# Patient Record
Sex: Female | Born: 1980
Health system: Southern US, Community
[De-identification: ages and names within clinical notes are randomized; demographics above are authoritative.]

## PROBLEM LIST (undated history)

## (undated) ENCOUNTER — Ambulatory Visit (HOSPITAL_COMMUNITY): Admission: EM | Discharge status: Absent without leave | Payer: Self-pay

## (undated) DIAGNOSIS — H6690 Otitis media, unspecified, unspecified ear: Secondary | ICD-10-CM

## (undated) HISTORY — DX: Otitis media, unspecified, unspecified ear: H66.90

---
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
## 2000-01-27 ENCOUNTER — Other Ambulatory Visit: Admission: RE | Admit: 2000-01-27 | Discharge: 2000-01-27 | Payer: Self-pay | Admitting: Gynecology

## 2000-08-01 ENCOUNTER — Encounter: Payer: Self-pay | Admitting: Infectious Diseases

## 2000-08-01 ENCOUNTER — Ambulatory Visit (HOSPITAL_COMMUNITY): Admission: RE | Admit: 2000-08-01 | Discharge: 2000-08-01 | Payer: Self-pay | Admitting: Infectious Diseases

## 2001-04-12 ENCOUNTER — Other Ambulatory Visit: Admission: RE | Admit: 2001-04-12 | Discharge: 2001-04-12 | Payer: Self-pay | Admitting: Obstetrics and Gynecology

## 2002-07-03 ENCOUNTER — Other Ambulatory Visit: Admission: RE | Admit: 2002-07-03 | Discharge: 2002-07-03 | Payer: Self-pay | Admitting: Obstetrics and Gynecology

## 2003-08-02 ENCOUNTER — Other Ambulatory Visit: Admission: RE | Admit: 2003-08-02 | Discharge: 2003-08-02 | Payer: Self-pay | Admitting: Obstetrics and Gynecology

## 2003-10-02 ENCOUNTER — Ambulatory Visit (HOSPITAL_COMMUNITY): Admission: RE | Admit: 2003-10-02 | Discharge: 2003-10-02 | Payer: Self-pay | Admitting: Gastroenterology

## 2004-09-03 ENCOUNTER — Other Ambulatory Visit: Admission: RE | Admit: 2004-09-03 | Discharge: 2004-09-03 | Payer: Self-pay | Admitting: Obstetrics and Gynecology

## 2004-12-21 ENCOUNTER — Encounter: Admission: RE | Admit: 2004-12-21 | Discharge: 2005-01-06 | Payer: Self-pay | Admitting: Nurse Practitioner

## 2005-05-28 ENCOUNTER — Encounter: Admission: RE | Admit: 2005-05-28 | Discharge: 2005-05-28 | Payer: Self-pay | Admitting: Occupational Medicine

## 2007-07-31 ENCOUNTER — Ambulatory Visit (HOSPITAL_COMMUNITY): Admission: RE | Admit: 2007-07-31 | Discharge: 2007-07-31 | Payer: Self-pay | Admitting: Gastroenterology

## 2010-04-29 ENCOUNTER — Ambulatory Visit (HOSPITAL_COMMUNITY)
Admission: RE | Admit: 2010-04-29 | Discharge: 2010-04-29 | Payer: Self-pay | Source: Home / Self Care | Admitting: Gastroenterology

## 2010-06-20 ENCOUNTER — Encounter: Payer: Self-pay | Admitting: Gastroenterology

## 2010-06-24 ENCOUNTER — Encounter
Admission: RE | Admit: 2010-06-24 | Discharge: 2010-06-24 | Payer: Self-pay | Source: Home / Self Care | Attending: Gastroenterology | Admitting: Gastroenterology

## 2013-06-09 ENCOUNTER — Ambulatory Visit (INDEPENDENT_AMBULATORY_CARE_PROVIDER_SITE_OTHER): Payer: BC Managed Care – PPO | Admitting: Family Medicine

## 2013-06-09 VITALS — BP 130/80 | HR 86 | Temp 99.0°F | Resp 16 | Ht 64.5 in | Wt 151.4 lb

## 2013-06-09 DIAGNOSIS — R6889 Other general symptoms and signs: Secondary | ICD-10-CM

## 2013-06-09 LAB — POCT CBC
Granulocyte percent: 62.6 %G (ref 37–80)
HCT, POC: 44.2 % (ref 37.7–47.9)
Hemoglobin: 13.9 g/dL (ref 12.2–16.2)
Lymph, poc: 1.4 (ref 0.6–3.4)
MCH, POC: 28.8 pg (ref 27–31.2)
MCHC: 31.4 g/dL — AB (ref 31.8–35.4)
MCV: 91.6 fL (ref 80–97)
MID (cbc): 0.3 (ref 0–0.9)
MPV: 8.3 fL (ref 0–99.8)
POC Granulocyte: 2.9 (ref 2–6.9)
POC LYMPH PERCENT: 30.2 %L (ref 10–50)
POC MID %: 7.2 %M (ref 0–12)
Platelet Count, POC: 242 10*3/uL (ref 142–424)
RBC: 4.82 M/uL (ref 4.04–5.48)
RDW, POC: 12.9 %
WBC: 4.6 10*3/uL (ref 4.6–10.2)

## 2013-06-09 MED ORDER — IPRATROPIUM BROMIDE 0.03 % NA SOLN: 2.0000 | Freq: Four times a day (QID) | NASAL | Status: DC

## 2013-06-09 MED ORDER — HYDROCODONE-HOMATROPINE 5-1.5 MG/5ML PO SYRP: 5.0000 mL | ORAL_SOLUTION | Freq: Three times a day (TID) | ORAL | Status: DC | PRN

## 2013-06-09 MED ORDER — PSEUDOEPHEDRINE HCL ER 120 MG PO TB12: 120.0000 mg | ORAL_TABLET | Freq: Two times a day (BID) | ORAL | Status: DC

## 2013-06-09 NOTE — Patient Instructions (Signed)
Hot showers or breathing in steam may help loosen the congestion.  Using a netti pot or sinus rinse is also likely to help you feel better and keep this from progressing.  Use the atrovent nasal spray as needed throughout the day.  I recommend augmenting with 12 hr sudafed (behind the counter) and generic mucinex to help you move out the congestion.  If no improvement or you are getting worse, come back as you might need a course of steroids but hopefully with all of the above, you can avoid it.  Influenza, Adult Influenza ("the flu") is a viral infection of the respiratory tract. It occurs more often in winter months because people spend more time in close contact with one another. Influenza can make you feel very sick. Influenza easily spreads from person to person (contagious). CAUSES  Influenza is caused by a virus that infects the respiratory tract. You can catch the virus by breathing in droplets from an infected person's cough or sneeze. You can also catch the virus by touching something that was recently contaminated with the virus and then touching your mouth, nose, or eyes. SYMPTOMS  Symptoms typically last 4 to 10 days and may include:  Fever.  Chills.  Headache, body aches, and muscle aches.  Sore throat.  Chest discomfort and cough.  Poor appetite.  Weakness or feeling tired.  Dizziness.  Nausea or vomiting. DIAGNOSIS  Diagnosis of influenza is often made based on your history and a physical exam. A nose or throat swab test can be done to confirm the diagnosis. RISKS AND COMPLICATIONS You may be at risk for a more severe case of influenza if you smoke cigarettes, have diabetes, have chronic heart disease (such as heart failure) or lung disease (such as asthma), or if you have a weakened immune system. Elderly people and pregnant women are also at risk for more serious infections. The most common complication of influenza is a lung infection (pneumonia). Sometimes, this  complication can require emergency medical care and may be life-threatening. PREVENTION  An annual influenza vaccination (flu shot) is the best way to avoid getting influenza. An annual flu shot is now routinely recommended for all adults in the [place] TREATMENT  In mild cases, influenza goes away on its own. Treatment is directed at relieving symptoms. For more severe cases, your caregiver may prescribe antiviral medicines to shorten the sickness. Antibiotic medicines are not effective, because the infection is caused by a virus, not by bacteria. HOME CARE INSTRUCTIONS  Only take over-the-counter or prescription medicines for pain, discomfort, or fever as directed by your caregiver.  Use a cool mist humidifier to make breathing easier.  Get plenty of rest until your temperature returns to normal. This usually takes 3 to 4 days.  Drink enough fluids to keep your urine clear or pale yellow.  Cover your mouth and nose when coughing or sneezing, and wash your hands well to avoid spreading the virus.  Stay home from work or school until your fever has been gone for at least 1 full day. SEEK MEDICAL CARE IF:   You have chest pain or a deep cough that worsens or produces more mucus.  You have nausea, vomiting, or diarrhea. SEEK IMMEDIATE MEDICAL CARE IF:   You have difficulty breathing, shortness of breath, or your skin or nails turn bluish.  You have severe neck pain or stiffness.  You have a severe headache, facial pain, or earache.  You have a worsening or recurring fever.  You  have nausea or vomiting that cannot be controlled. MAKE SURE YOU:  Understand these instructions.  Will watch your condition.  Will get help right away if you are not doing well or get worse. Document Released: 05/14/2000 Document Revised: 11/16/2011 Document Reviewed: 08/16/2011 [facility] Patient Information 2014 [place]Care, [place].

## 2013-06-09 NOTE — Progress Notes (Signed)
Subjective:    Patient ID: Sierra Foster, female    DOB: [birth year], 33 y.o.   MRN: [id number] Chief Complaint  Patient presents with  . Follow-up    diagnosed with flu Tues just not feeling better still running fever now has bloody nose    HPI This chart was scribed for Norberto Sorenson, MD by Andrew Au, ED Scribe. This patient was seen in room 1 and the patient's care was started at 9:28 AM.  HPI Comments: Sierra Foster is a 33 y.o. female who presents to the [facility] complaining of a constant, worsening, unproductive cough onset 5 days ago. Pt reports her coughing worsens at night. She has associated nasal congestion and fatigue. She reports that when she blows her nose she has seen blood with mucous.  Pt states that she has been sick for about 2 weeks. [facility] for the flu which resulted positive. She was prescribed Tessalon pearls and cough syrup with relief to symotoms. She was not rx'ed tamiflu as she had been ill for 2 wks but she thinks she was getting a little better and then got significantly worse w/ f/c/myalgias 2d prior to + flu test. She also has been taking mucinex for her symptoms. She reports having intermittent fevers of about 101.2 every night since Tuesday. She denies ear pain and jaw pain.    No past medical history on file. Allergies  Allergen Reactions  . Penicillins    Prior to Admission medications   Medication Sig Start Date End Date Taking? Authorizing Provider  benzonatate (TESSALON) 100 MG capsule Take by mouth 3 (three) times daily as needed for cough.   Yes Historical Provider, MD  cetirizine (ZYRTEC) 10 MG tablet Take 10 mg by mouth daily.   Yes Historical Provider, MD  dextromethorphan-guaiFENesin (ROBITUSSIN-DM) 10-100 MG/5ML liquid Take by mouth every 4 (four) hours as needed for cough.   Yes Historical Provider, MD  etonogestrel-ethinyl estradiol (NUVARING) 0.12-0.015 MG/24HR vaginal ring Place 1 each  vaginally every 28 (twenty-eight) days. Insert vaginally and leave in place for 3 consecutive weeks, then remove for 1 week.   Yes Historical Provider, MD        Review of Systems  Constitutional: Positive for fever, chills, diaphoresis, activity change, appetite change and fatigue.  HENT: Positive for congestion, nosebleeds, postnasal drip, rhinorrhea and sore throat. Negative for ear discharge, ear pain, mouth sores, sinus pressure, sneezing, trouble swallowing and voice change.        No jaw pain  Eyes: Negative for pain.  Respiratory: Positive for cough. Negative for shortness of breath.   Cardiovascular: Negative for chest pain.  Gastrointestinal: Negative for vomiting and abdominal pain.  Genitourinary: Negative for dysuria.  Musculoskeletal: Positive for myalgias. Negative for arthralgias, gait problem, joint swelling, neck pain and neck stiffness.  Skin: Negative for rash.  Neurological: Positive for weakness and headaches. Negative for dizziness and syncope.  Hematological: Negative for adenopathy.  Psychiatric/Behavioral: Positive for sleep disturbance.       Objective:   Physical Exam  Nursing note and vitals reviewed. Constitutional: She is oriented to person, place, and time. She appears well-developed and well-nourished. No distress.  HENT:  Head: Normocephalic and atraumatic.  Right Ear: Tympanic membrane normal.  Left Ear: Tympanic membrane normal.  Nose: Mucosal edema ( left worse in right) present.  Mouth/Throat: Posterior oropharyngeal erythema present.  Eyes: EOM are normal.  Neck: Trachea normal. Neck supple. No mass  and no thyromegaly present.  Cardiovascular: Normal rate, regular rhythm and normal heart sounds.   Pulmonary/Chest: Effort normal. No respiratory distress.  Musculoskeletal: Normal range of motion.  Lymphadenopathy:       Head (right side): No submandibular, no preauricular and no posterior auricular adenopathy present.       Head (left  side): No submandibular, no preauricular and no posterior auricular adenopathy present.    She has cervical adenopathy.       Right cervical: Superficial cervical adenopathy present. No posterior cervical adenopathy present.      Left cervical: Superficial cervical adenopathy present. No posterior cervical adenopathy present.       Right: No supraclavicular adenopathy present.       Left: No supraclavicular adenopathy present.  Neurological: She is alert and oriented to person, place, and time.  Skin: Skin is warm and dry.  Psychiatric: She has a normal mood and affect. Her behavior is normal.    BP 130/80  Pulse 86  Temp(Src) 99 F (37.2 C) (Oral)  Resp 16  Ht 5' 4.5" (1.638 m)  Wt 151 lb 6.4 oz (68.675 kg)  BMI 25.60 kg/m2  SpO2 97%  LMP 05/26/2013  Results for orders placed in visit on 06/09/13  POCT CBC      Result Value Range   WBC 4.6  4.6 - 10.2 K/uL   Lymph, poc 1.4  0.6 - 3.4   POC LYMPH PERCENT 30.2  10 - 50 %L   MID (cbc) 0.3  0 - 0.9   POC MID % 7.2  0 - 12 %M   POC Granulocyte 2.9  2 - 6.9   Granulocyte percent 62.6  37 - 80 %G   RBC 4.82  4.04 - 5.48 M/uL   Hemoglobin 13.9  12.2 - 16.2 g/dL   HCT, POC 16.144.2  09.637.7 - 47.9 %   MCV 91.6  80 - 97 fL   MCH, POC 28.8  27 - 31.2 pg   MCHC 31.4 (*) 31.8 - 35.4 g/dL   RDW, POC 04.512.9     Platelet Count, POC 242  142 - 424 K/uL   MPV 8.3  0 - 99.8 fL        Assessment & Plan:   Flu-like symptoms - Plan: POCT CBC Cont symptomatic treatment - out of work note given. Meds ordered this encounter  Medications  . etonogestrel-ethinyl estradiol (NUVARING) 0.12-0.015 MG/24HR vaginal ring    Sig: Place 1 each vaginally every 28 (twenty-eight) days. Insert vaginally and leave in place for 3 consecutive weeks, then remove for 1 week.  . benzonatate (TESSALON) 100 MG capsule    Sig: Take by mouth 3 (three) times daily as needed for cough.  . cetirizine (ZYRTEC) 10 MG tablet    Sig: Take 10 mg by mouth daily.  Marland Kitchen.  dextromethorphan-guaiFENesin (ROBITUSSIN-DM) 10-100 MG/5ML liquid    Sig: Take by mouth every 4 (four) hours as needed for cough.  Marland Kitchen. HYDROcodone-homatropine (HYCODAN) 5-1.5 MG/5ML syrup    Sig: Take 5 mLs by mouth every 8 (eight) hours as needed for cough.    Dispense:  120 mL    Refill:  0  . pseudoephedrine (SUDAFED 12 HOUR) 120 MG 12 hr tablet    Sig: Take 1 tablet (120 mg total) by mouth 2 (two) times daily.    Dispense:  30 tablet    Refill:  0  . ipratropium (ATROVENT) 0.03 % nasal spray    Sig: Place 2 sprays into  the nose 4 (four) times daily.    Dispense:  30 mL    Refill:  1    I personally performed the services described in this documentation, which was scribed in my presence. The recorded information has been reviewed and considered, and addended by me as needed.  Norberto Sorenson, MD MPH

## 2013-06-12 DIAGNOSIS — Z0271 Encounter for disability determination: Secondary | ICD-10-CM

## 2013-06-15 ENCOUNTER — Telehealth: Payer: Self-pay

## 2013-06-15 NOTE — Telephone Encounter (Signed)
FMLA paperwork in Dr. Alver FisherShaw's box to be completed.

## 2013-06-19 NOTE — Telephone Encounter (Signed)
Dr. Clelia CroftShaw I put this paperwork in your box last week. Patient has called to check on status. Can you please let me know if you have received this and if I need to get any more information from the patient.  Thanks! Dala DockEliana

## 2013-06-19 NOTE — Telephone Encounter (Signed)
completed

## 2013-06-21 NOTE — Telephone Encounter (Signed)
Paperwork in pickup drawer. LMOM for patient.

## 2013-07-03 ENCOUNTER — Encounter (HOSPITAL_BASED_OUTPATIENT_CLINIC_OR_DEPARTMENT_OTHER): Payer: Self-pay | Admitting: Emergency Medicine

## 2013-07-03 ENCOUNTER — Emergency Department (HOSPITAL_BASED_OUTPATIENT_CLINIC_OR_DEPARTMENT_OTHER)
Admission: EM | Admit: 2013-07-03 | Discharge: 2013-07-03 | Disposition: A | Discharge status: Home / Self Care | Payer: BC Managed Care – PPO | Attending: Emergency Medicine | Admitting: Emergency Medicine

## 2013-07-03 DIAGNOSIS — K5289 Other specified noninfective gastroenteritis and colitis: Secondary | ICD-10-CM | POA: Insufficient documentation

## 2013-07-03 DIAGNOSIS — K529 Noninfective gastroenteritis and colitis, unspecified: Secondary | ICD-10-CM

## 2013-07-03 DIAGNOSIS — Z880 Allergy status to penicillin: Secondary | ICD-10-CM | POA: Insufficient documentation

## 2013-07-03 DIAGNOSIS — Z3202 Encounter for pregnancy test, result negative: Secondary | ICD-10-CM | POA: Insufficient documentation

## 2013-07-03 DIAGNOSIS — Z79899 Other long term (current) drug therapy: Secondary | ICD-10-CM | POA: Insufficient documentation

## 2013-07-03 LAB — URINE MICROSCOPIC-ADD ON

## 2013-07-03 LAB — URINALYSIS, ROUTINE W REFLEX MICROSCOPIC
Bilirubin Urine: NEGATIVE
Glucose, UA: NEGATIVE mg/dL
Ketones, ur: 15 mg/dL — AB
Leukocytes, UA: NEGATIVE
Nitrite: NEGATIVE
Protein, ur: NEGATIVE mg/dL
Specific Gravity, Urine: 1.021 (ref 1.005–1.030)
Urobilinogen, UA: 0.2 mg/dL (ref 0.0–1.0)
pH: 5 (ref 5.0–8.0)

## 2013-07-03 LAB — PREGNANCY, URINE: Preg Test, Ur: NEGATIVE

## 2013-07-03 MED ORDER — ONDANSETRON HCL 4 MG/2ML IJ SOLN
4.0000 mg | Freq: Once | INTRAMUSCULAR | Status: AC
  Administered 2013-07-03: 4 mg via INTRAVENOUS
  Filled 2013-07-03: qty 2

## 2013-07-03 MED ORDER — ONDANSETRON 8 MG PO TBDP: ORAL_TABLET | ORAL | Status: DC

## 2013-07-03 MED ORDER — SODIUM CHLORIDE 0.9 % IV SOLN
Freq: Once | INTRAVENOUS | Status: AC
  Administered 2013-07-03: 1000 mL via INTRAVENOUS

## 2013-07-03 MED ORDER — SODIUM CHLORIDE 0.9 % IV BOLUS (SEPSIS)
1000.0000 mL | Freq: Once | INTRAVENOUS | Status: AC
  Administered 2013-07-03: 1000 mL via INTRAVENOUS

## 2013-07-03 NOTE — ED Provider Notes (Signed)
CSN: [id number]     Arrival date & time 07/03/13  0305 History   First MD Initiated Contact with Patient 07/03/13 0320     Chief Complaint  Patient presents with  . Diarrhea   (Consider location/radiation/quality/duration/timing/severity/associated sxs/prior Treatment) HPI Comments: Patient presents with nausea vomiting and diarrhea. She states this started earlier this evening around 7:00. Her first symptoms were chills and headache. Shortly thereafter she started having watery diarrhea and ongoing nonbloody, nonbilious vomiting. She hasn't had any known fevers. She hasn't been able to keep anything down since that time. She has intermittent crampy abdominal pain. She denies any urinary symptoms. She's not taking anything at home for the symptoms.  Patient is a 33 y.o. female presenting with diarrhea.  Diarrhea Associated symptoms: abdominal pain, chills, headaches, myalgias and vomiting   Associated symptoms: no fever     History reviewed. No pertinent past medical history. History reviewed. No pertinent past surgical history. History reviewed. No pertinent family history. History  Substance Use Topics  . Smoking status: Never Smoker   . Smokeless tobacco: Not on file  . Alcohol Use: Not on file   OB History   Grav Para Term Preterm Abortions TAB SAB Ect Mult Living                 Review of Systems  Constitutional: Positive for chills and appetite change. Negative for fever.  Respiratory: Negative for chest tightness and shortness of breath.   Cardiovascular: Negative for chest pain.  Gastrointestinal: Positive for nausea, vomiting, abdominal pain and diarrhea. Negative for blood in stool and abdominal distention.  Genitourinary: Negative for dysuria.  Musculoskeletal: Positive for myalgias.  Neurological: Positive for headaches. Negative for light-headedness.  All other systems reviewed and are negative.    Allergies  Penicillins  Home Medications   Current  Outpatient Rx  Name  Route  Sig  Dispense  Refill  . benzonatate (TESSALON) 100 MG capsule   Oral   Take by mouth 3 (three) times daily as needed for cough.         . cetirizine (ZYRTEC) 10 MG tablet   Oral   Take 10 mg by mouth daily.         Marland Kitchen. dextromethorphan-guaiFENesin (ROBITUSSIN-DM) 10-100 MG/5ML liquid   Oral   Take by mouth every 4 (four) hours as needed for cough.         . etonogestrel-ethinyl estradiol (NUVARING) 0.12-0.015 MG/24HR vaginal ring   Vaginal   Place 1 each vaginally every 28 (twenty-eight) days. Insert vaginally and leave in place for 3 consecutive weeks, then remove for 1 week.         Marland Kitchen. HYDROcodone-homatropine (HYCODAN) 5-1.5 MG/5ML syrup   Oral   Take 5 mLs by mouth every 8 (eight) hours as needed for cough.   120 mL   0   . ipratropium (ATROVENT) 0.03 % nasal spray   Nasal   Place 2 sprays into the nose 4 (four) times daily.   30 mL   1   . ondansetron (ZOFRAN ODT) 8 MG disintegrating tablet      8mg  ODT q4 hours prn nausea   10 tablet   0   . pseudoephedrine (SUDAFED 12 HOUR) 120 MG 12 hr tablet   Oral   Take 1 tablet (120 mg total) by mouth 2 (two) times daily.   30 tablet   0    BP 133/75  Pulse 103  Temp(Src) 98.7 F (37.1 C) (Oral)  Resp 18  Ht 5\' 4"  (1.626 m)  Wt 148 lb (67.132 kg)  BMI 25.39 kg/m2  SpO2 100%  LMP 06/26/2013 Physical Exam  Constitutional: She is oriented to person, place, and time. She appears well-developed and well-nourished.  HENT:  Head: Normocephalic and atraumatic.  Mouth/Throat: Oropharynx is clear and moist.  Eyes: Pupils are equal, round, and reactive to light.  Neck: Normal range of motion. Neck supple.  Cardiovascular: Normal rate, regular rhythm and normal heart sounds.   Pulmonary/Chest: Effort normal and breath sounds normal. No respiratory distress. She has no wheezes. She has no rales. She exhibits no tenderness.  Abdominal: Soft. Bowel sounds are normal. There is no tenderness.  There is no rebound and no guarding.  Musculoskeletal: Normal range of motion. She exhibits no edema.  Lymphadenopathy:    She has no cervical adenopathy.  Neurological: She is alert and oriented to person, place, and time.  Skin: Skin is warm and dry. No rash noted.  Psychiatric: She has a normal mood and affect.    ED Course  Procedures (including critical care time) Labs Review Labs Reviewed  URINALYSIS, ROUTINE W REFLEX MICROSCOPIC - Abnormal; Notable for the following:    APPearance CLOUDY (*)    Hgb urine dipstick MODERATE (*)    Ketones, ur 15 (*)    All other components within normal limits  URINE MICROSCOPIC-ADD ON - Abnormal; Notable for the following:    Bacteria, UA FEW (*)    All other components within normal limits  PREGNANCY, URINE   Imaging Review No results found.  EKG Interpretation   None       MDM   1. Gastroenteritis    Patient presents with vomiting and diarrhea. She reports some abdominal cramping but has no significant abdominal tenderness on exam. Her symptoms are most consistent with a viral gastroenteritis. She has no tenderness around her appendix area or gallbladder. She has no evidence of urinary tract infection. She improved after some IV fluids and Zofran. She had no ongoing vomiting. She was discharged home in good condition. I gave her prescription for Zofran to use as needed. I encouraged her to stay on clear liquids for the next 12-24 hours. I did advise her that she had some microscopic hematuria and at some point in the near future she needs to have her urine rechecked by her PMD to make sure the blood clears.    Rolan Bucco, MD 07/03/13 [phone]

## 2013-07-03 NOTE — Discharge Instructions (Signed)
Viral Gastroenteritis Viral gastroenteritis is also known as stomach flu. This condition affects the stomach and intestinal tract. It can cause sudden diarrhea and vomiting. The illness typically lasts 3 to 8 days. Most people develop an immune response that eventually gets rid of the virus. While this natural response develops, the virus can make you quite ill. CAUSES  Many different viruses can cause gastroenteritis, such as rotavirus or noroviruses. You can catch one of these viruses by consuming contaminated food or water. You may also catch a virus by sharing utensils or other personal items with an infected person or by touching a contaminated surface. SYMPTOMS  The most common symptoms are diarrhea and vomiting. These problems can cause a severe loss of body fluids (dehydration) and a body salt (electrolyte) imbalance. Other symptoms may include:  Fever.  Headache.  Fatigue.  Abdominal pain. DIAGNOSIS  Your caregiver can usually diagnose viral gastroenteritis based on your symptoms and a physical exam. A stool sample may also be taken to test for the presence of viruses or other infections. TREATMENT  This illness typically goes away on its own. Treatments are aimed at rehydration. The most serious cases of viral gastroenteritis involve vomiting so severely that you are not able to keep fluids down. In these cases, fluids must be given through an intravenous line (IV). HOME CARE INSTRUCTIONS   Drink enough fluids to keep your urine clear or pale yellow. Drink small amounts of fluids frequently and increase the amounts as tolerated.  Ask your caregiver for specific rehydration instructions.  Avoid:  Foods high in sugar.  Alcohol.  Carbonated drinks.  Tobacco.  Juice.  Caffeine drinks.  Extremely hot or cold fluids.  Fatty, greasy foods.  Too much intake of anything at one time.  Dairy products until 24 to 48 hours after diarrhea stops.  You may consume probiotics.  Probiotics are active cultures of beneficial bacteria. They may lessen the amount and number of diarrheal stools in adults. Probiotics can be found in yogurt with active cultures and in supplements.  Wash your hands well to avoid spreading the virus.  Only take over-the-counter or prescription medicines for pain, discomfort, or fever as directed by your caregiver. Do not give aspirin to children. Antidiarrheal medicines are not recommended.  Ask your caregiver if you should continue to take your regular prescribed and over-the-counter medicines.  Keep all follow-up appointments as directed by your caregiver. SEEK IMMEDIATE MEDICAL CARE IF:   You are unable to keep fluids down.  You do not urinate at least once every 6 to 8 hours.  You develop shortness of breath.  You notice blood in your stool or vomit. This may look like coffee grounds.  You have abdominal pain that increases or is concentrated in one small area (localized).  You have persistent vomiting or diarrhea.  You have a fever.  The patient is a child younger than 3 months, and he or she has a fever.  The patient is a child older than 3 months, and he or she has a fever and persistent symptoms.  The patient is a child older than 3 months, and he or she has a fever and symptoms suddenly get worse.  The patient is a baby, and he or she has no tears when crying. MAKE SURE YOU:   Understand these instructions.  Will watch your condition.  Will get help right away if you are not doing well or get worse. Document Released: 05/17/2005 Document Revised: 08/09/2011 Document Reviewed: 03/03/2011   [employer] Patient Information 2014 [employer].  

## 2013-07-03 NOTE — ED Notes (Signed)
Developed acute onset of n/v/d around 2200 last pm, increased in frequency

## 2013-07-03 NOTE — ED Notes (Signed)
N/v/d onset 2200 last pm,  Diarrhea worse than vomiting  Both have been several time,  abd pain

## 2013-07-06 ENCOUNTER — Encounter: Payer: Self-pay | Admitting: Family Medicine

## 2014-12-26 ENCOUNTER — Emergency Department (HOSPITAL_COMMUNITY)
Admission: EM | Admit: 2014-12-26 | Discharge: 2014-12-26 | Disposition: A | Discharge status: Home / Self Care | Payer: Self-pay

## 2015-03-04 ENCOUNTER — Encounter: Payer: Self-pay | Admitting: Emergency Medicine

## 2015-12-10 MED FILL — METAXALONE 800 MG TABLET: 800 | 5 days supply | Qty: 15 | Fill #1

## 2015-12-25 ENCOUNTER — Encounter: Payer: Self-pay | Admitting: Allergy and Immunology

## 2015-12-25 ENCOUNTER — Ambulatory Visit (INDEPENDENT_AMBULATORY_CARE_PROVIDER_SITE_OTHER): Payer: BLUE CROSS/BLUE SHIELD | Admitting: Allergy and Immunology

## 2015-12-25 DIAGNOSIS — J3089 Other allergic rhinitis: Secondary | ICD-10-CM | POA: Insufficient documentation

## 2015-12-25 MED ORDER — LEVOCETIRIZINE DIHYDROCHLORIDE 5 MG PO TABS
5.0000 mg | ORAL_TABLET | Freq: Every evening | ORAL | 5 refills | Status: DC
Start: 2015-12-25 — End: 2019-07-11

## 2015-12-25 MED ORDER — AZELASTINE-FLUTICASONE 137-50 MCG/ACT NA SUSP: 2.0000 | NASAL | 5 refills | Status: DC

## 2015-12-25 NOTE — Progress Notes (Addendum)
New Patient Note  RE: Sierra Foster MRN: [id number] DOB: 31-May-1981 Date of Office Visit: 12/25/2015  Referring provider: Collene Gobble, MD Primary care provider: Lucilla Edin, MD  Chief Complaint: Allergic Rhinitis  and Sinus Problem   History of present illness: Sierra Foster is a 35 y.o. female presenting today for consultation of rhinoconjunctivitis. She reports that over the past 2 years she has been experiencing progressively increasing nasal congestion, rhinorrhea "pours", postnasal drainage, irritated throat, ear pressure, and sinus pressure over the forehead and cheekbones.  No significant seasonal symptom variation has been noted nor have specific environmental triggers been identified.  Cetirizine, fexofenadine, loratadine, and Nasacort have failed to provide adequate symptom relief.  She typically develops 2 or 3 sinus infections per year.  She is followed by an otolaryngologist for recurrent otitis media.  She denies significant lower respiratory symptoms including dyspnea, chest tightness, and wheezing.    Assessment and plan: Perennial allergic rhinosinusitis with a nonallergic component  Aeroallergen avoidance measures have been discussed and provided in written form.  A prescription has been provided for levocetirizine, 5 mg daily as needed.  A prescription has been provided for Dymista (azelastine/fluticasone) nasal spray, 1 spray per nostril twice daily as needed. Proper nasal spray technique has been discussed and demonstrated.  Nasal saline lavage (NeilMed) as needed has been recommended along with instructions for proper administration.  For sinus pressure or thick postnasal drainage, guaifenesin 1200 mg (plus/minus pseudoephedrine 120 mg) twice daily as needed with adequate hydration. Pseudoephedrine is only to be used for short-term relief of nasal/sinus congestion. Long-term use is discouraged due to potential side effects.     Meds ordered this encounter    Medications  . levocetirizine (XYZAL) 5 MG tablet    Sig: Take 1 tablet (5 mg total) by mouth every evening.    Dispense:  30 tablet    Refill:  5  . Azelastine-Fluticasone (DYMISTA) 137-50 MCG/ACT SUSP    Sig: Place 2 sprays into both nostrils 1 day or 1 dose.    Dispense:  1 Bottle    Refill:  5    Diagnositics: Epicutaneous testing:  Negative despite a positive histamine control. Intradermal testing: Positive to molds and cockroach antigen.    Physical examination: Blood pressure 112/60, pulse 82, temperature 98.2 F (36.8 C), temperature source Oral, height 5' 4.25" (1.632 m), weight 147 lb 9.6 oz (67 kg).  General: Alert, interactive, in no acute distress. HEENT: TMs pearly gray, turbinates edematous with clear discharge, post-pharynx moderately erythematous. Neck: Supple without lymphadenopathy. Lungs: Clear to auscultation without wheezing, rhonchi or rales. CV: Normal S1, S2 without murmurs. Abdomen: Nondistended, nontender. Skin: Warm and dry, without lesions or rashes. Extremities:  No clubbing, cyanosis or edema. Neuro:   Grossly intact.  Review of systems:  Review of Systems  Constitutional: Negative for chills, fever and weight loss.  HENT: Positive for congestion. Negative for nosebleeds.   Eyes: Negative for blurred vision.  Respiratory: Negative for hemoptysis, shortness of breath and wheezing.   Cardiovascular: Negative for chest pain.  Gastrointestinal: Negative for constipation and diarrhea.  Genitourinary: Negative for dysuria.  Musculoskeletal: Negative for joint pain and myalgias.  Skin: Negative for itching and rash.  Neurological: Positive for headaches. Negative for dizziness.  Endo/Heme/Allergies: Positive for environmental allergies. Does not bruise/bleed easily.    Past medical history:  History reviewed. No pertinent past medical history.  Past surgical history:  Past Surgical History:  Procedure Laterality Date  . APPENDECTOMY  1998  Family history: Family History  Problem Relation Age of Onset  . Allergic rhinitis Father   . Allergic rhinitis Sister   . Asthma Neg Hx   . Eczema Neg Hx   . Urticaria Neg Hx   . Immunodeficiency Neg Hx   . Atopy Neg Hx   . Angioedema Neg Hx     Social history: Social History   Social History  . Marital status: Married    Spouse name: N/A  . Number of children: N/A  . Years of education: N/A   Occupational History  . Not on file.   Social History Main Topics  . Smoking status: Never Smoker  . Smokeless tobacco: Never Used  . Alcohol use Yes     Comment: occasional  . Drug use: No  . Sexual activity: Not on file   Other Topics Concern  . Not on file   Social History Narrative  . No narrative on file   Environmental History: The patient lives in a 35 year old house with hardwood floors throughout, gas heat, and central air.  There are 2 dogs in house which have access to her bedroom.  She is a nonsmoker.    Medication List       Accurate as of 12/25/15  1:51 PM. Always use your most recent med list.          Azelastine-Fluticasone 137-50 MCG/ACT Susp Commonly known as:  DYMISTA Place 2 sprays into both nostrils 1 day or 1 dose.   cetirizine 10 MG tablet Commonly known as:  ZYRTEC Take 10 mg by mouth daily.   ciprofloxacin-dexamethasone otic suspension Commonly known as:  CIPRODEX 4 drops 2 (two) times daily.   dextromethorphan-guaiFENesin 10-100 MG/5ML liquid Commonly known as:  ROBITUSSIN-DM Take by mouth every 4 (four) hours as needed for cough.   folic acid 1 MG tablet Commonly known as:  FOLVITE Take 1 mg by mouth daily.   ipratropium 0.03 % nasal spray Commonly known as:  ATROVENT Place 2 sprays into the nose 4 (four) times daily.   levocetirizine 5 MG tablet Commonly known as:  XYZAL Take 1 tablet (5 mg total) by mouth every evening.   prenatal multivitamin Tabs tablet Take 1 tablet by mouth daily at 12 noon.       Known  medication allergies: Allergies  Allergen Reactions  . Penicillins     I appreciate the opportunity to take part in Chaelyn's care. Please do not hesitate to contact me with questions.  Sincerely,   R. Jorene Guest, MD

## 2015-12-25 NOTE — Assessment & Plan Note (Signed)
   Aeroallergen avoidance measures have been discussed and provided in written form.  A prescription has been provided for levocetirizine, 5 mg daily as needed.  A prescription has been provided for Dymista (azelastine/fluticasone) nasal spray, 1 spray per nostril twice daily as needed. Proper nasal spray technique has been discussed and demonstrated.  Nasal saline lavage (NeilMed) as needed has been recommended along with instructions for proper administration.  For sinus pressure or thick postnasal drainage, guaifenesin 1200 mg (plus/minus pseudoephedrine 120 mg) twice daily as needed with adequate hydration. Pseudoephedrine is only to be used for short-term relief of nasal/sinus congestion. Long-term use is discouraged due to potential side effects.

## 2015-12-25 NOTE — Patient Instructions (Addendum)
Perennial allergic rhinosinusitis with a nonallergic component  Aeroallergen avoidance measures have been discussed and provided in written form.  A prescription has been provided for levocetirizine, 5 mg daily as needed.  A prescription has been provided for Dymista (azelastine/fluticasone) nasal spray, 1 spray per nostril twice daily as needed. Proper nasal spray technique has been discussed and demonstrated.  Nasal saline lavage (NeilMed) as needed has been recommended along with instructions for proper administration.  For sinus pressure or thick postnasal drainage, guaifenesin 1200 mg (plus/minus pseudoephedrine 120 mg) twice daily as needed with adequate hydration. Pseudoephedrine is only to be used for short-term relief of nasal/sinus congestion. Long-term use is discouraged due to potential side effects.     Return in about 4 months (around 04/26/2016), or if symptoms worsen or fail to improve.  Control of Mold Allergen  Mold and fungi can grow on a variety of surfaces provided certain temperature and moisture conditions exist.  Outdoor molds grow on plants, decaying vegetation and soil.  The major outdoor mold, Alternaria and Cladosporium, are found in very high numbers during hot and dry conditions.  Generally, a late Summer - Fall peak is seen for common outdoor fungal spores.  Rain will temporarily lower outdoor mold spore count, but counts rise rapidly when the rainy period ends.  The most important indoor molds are Aspergillus and Penicillium.  Dark, humid and poorly ventilated basements are ideal sites for mold growth.  The next most common sites of mold growth are the bathroom and the kitchen.  Outdoor [employer] 1. Use air conditioning and keep windows closed 2. Avoid exposure to decaying vegetation. 3. Avoid leaf raking. 4. Avoid grain handling. 5. Consider wearing a face mask if working in moldy areas.  Indoor Mold Control 1. Maintain humidity below 50%. 2. Clean  washable surfaces with 5% bleach solution. 3. Remove sources e.g. Contaminated carpets.  Control of Cockroach Allergen  Cockroach allergen has been identified as an important cause of acute attacks of asthma, especially in urban settings.  There are fifty-five species of cockroach that exist in the [place], however only three, the [place], [place] species produce allergen that can affect patients with Asthma.  Allergens can be obtained from fecal particles, egg casings and secretions from cockroaches.    1. Remove food sources. 2. Reduce access to water. 3. Seal access and entry points. 4. Spray runways with 0.5-1% Diazinon or Chlorpyrifos 5. Blow boric acid power under stoves and refrigerator. 6. Place bait stations (hydramethylnon) at feeding sites.

## 2016-01-02 ENCOUNTER — Telehealth: Payer: Self-pay

## 2016-01-02 NOTE — Telephone Encounter (Signed)
Pharmacy calling wanting to know if we can change the dymista which needed a PA to the 2 components azelastine and the fluticasone. OK to changed orders to the 2 components per Dr. Dellis Anes. Spoke to [place] at [facility] in [place] [phone]

## 2016-07-14 DIAGNOSIS — H1011 Acute atopic conjunctivitis, right eye: Secondary | ICD-10-CM | POA: Diagnosis not present

## 2016-07-22 DIAGNOSIS — H1011 Acute atopic conjunctivitis, right eye: Secondary | ICD-10-CM | POA: Diagnosis not present

## 2016-07-30 DIAGNOSIS — Z0100 Encounter for examination of eyes and vision without abnormal findings: Secondary | ICD-10-CM | POA: Diagnosis not present

## 2016-08-10 DIAGNOSIS — H60543 Acute eczematoid otitis externa, bilateral: Secondary | ICD-10-CM | POA: Diagnosis not present

## 2016-08-25 DIAGNOSIS — H60543 Acute eczematoid otitis externa, bilateral: Secondary | ICD-10-CM | POA: Diagnosis not present

## 2017-06-15 DIAGNOSIS — Z6825 Body mass index (BMI) 25.0-25.9, adult: Secondary | ICD-10-CM | POA: Diagnosis not present

## 2017-06-15 DIAGNOSIS — Z01419 Encounter for gynecological examination (general) (routine) without abnormal findings: Secondary | ICD-10-CM | POA: Diagnosis not present

## 2017-09-08 DIAGNOSIS — Z0100 Encounter for examination of eyes and vision without abnormal findings: Secondary | ICD-10-CM | POA: Diagnosis not present

## 2017-12-14 ENCOUNTER — Other Ambulatory Visit: Payer: Self-pay

## 2017-12-14 ENCOUNTER — Ambulatory Visit (INDEPENDENT_AMBULATORY_CARE_PROVIDER_SITE_OTHER): Payer: 59

## 2017-12-14 ENCOUNTER — Ambulatory Visit (INDEPENDENT_AMBULATORY_CARE_PROVIDER_SITE_OTHER): Payer: 59 | Admitting: Physician Assistant

## 2017-12-14 ENCOUNTER — Encounter: Payer: Self-pay | Admitting: Physician Assistant

## 2017-12-14 VITALS — BP 114/72 | HR 80 | Temp 99.2°F | Resp 20 | Ht 65.83 in | Wt 149.2 lb

## 2017-12-14 DIAGNOSIS — S93492A Sprain of other ligament of left ankle, initial encounter: Secondary | ICD-10-CM | POA: Diagnosis not present

## 2017-12-14 DIAGNOSIS — M79672 Pain in left foot: Secondary | ICD-10-CM

## 2017-12-14 MED ORDER — NAPROXEN 500 MG PO TABS: 500.0000 mg | ORAL_TABLET | Freq: Two times a day (BID) | ORAL | 0 refills | Status: DC

## 2017-12-14 NOTE — Progress Notes (Signed)
Sierra JulyKristin Krausz  MRN: [id number] DOB: 05/08/1981  Subjective:  Sierra Foster is a 37 y.o. female seen in office today for a chief complaint of left foot pain x 1 day. Has associated bruising and swelling.  Notes yesterday she did the stairmaster machine for the first time,  which she is not used to doing and also was dragging her friends daughter around on her foot yesterday and she weighs  100+ lbs.   Was wearing tennis shoes. Woke up this morning with foot pain.  Had a throbbing sensation.  Applied ice and took ibuprofen, which helped.  Now having pain with ambulation.  It is fine at rest.  Denies redness, warmth, numbness, tingling, fever, chills, nausea.  No prior no prior left foot injury noted.  No PMH of gout.  Review of Systems  Per HPI  Patient Active Problem List   Diagnosis Date Noted  . Perennial allergic rhinosinusitis with a nonallergic component 12/25/2015    Current Outpatient Medications on File Prior to Visit  Medication Sig Dispense Refill  . Azelastine-Fluticasone (DYMISTA) 137-50 MCG/ACT SUSP Place 2 sprays into both nostrils 1 day or 1 dose. 1 Bottle 5  . cetirizine (ZYRTEC) 10 MG tablet Take 10 mg by mouth daily.    Marland Kitchen. ketotifen (ZADITOR) 0.025 % ophthalmic solution 1 drop 2 (two) times daily.    . Prenatal Vit-Fe Fumarate-FA (PRENATAL MULTIVITAMIN) TABS tablet Take 1 tablet by mouth daily at 12 noon.    . ciprofloxacin-dexamethasone (CIPRODEX) otic suspension 4 drops 2 (two) times daily.    Marland Kitchen. dextromethorphan-guaiFENesin (ROBITUSSIN-DM) 10-100 MG/5ML liquid Take by mouth every 4 (four) hours as needed for cough.    . folic acid (FOLVITE) 1 MG tablet Take 1 mg by mouth daily.    Marland Kitchen. ipratropium (ATROVENT) 0.03 % nasal spray Place 2 sprays into the nose 4 (four) times daily. (Patient not taking: Reported on 12/14/2017) 30 mL 1  . levocetirizine (XYZAL) 5 MG tablet Take 1 tablet (5 mg total) by mouth every evening. (Patient not taking: Reported on 12/14/2017) 30 tablet 5   No  current facility-administered medications on file prior to visit.     Allergies  Allergen Reactions  . Penicillins        Objective:  BP 114/72 (BP Location: Right Arm, Patient Position: Sitting, Cuff Size: Normal)   Pulse 80   Temp 99.2 F (37.3 C) (Oral)   Resp 20   Ht 5' 5.83" (1.672 m)   Wt 149 lb 3.2 oz (67.7 kg)   LMP 11/26/2017 (Exact Date)   SpO2 100%   BMI 24.21 kg/m   Physical Exam  Constitutional: She is oriented to person, place, and time. She appears well-developed and well-nourished.  HENT:  Head: Normocephalic and atraumatic.  Eyes: Conjunctivae are normal.  Neck: Normal range of motion.  Cardiovascular:  Pulses:      Dorsalis pedis pulses are 2+ on the right side, and 2+ on the left side.       Posterior tibial pulses are 2+ on the right side, and 2+ on the left side.  Pulmonary/Chest: Effort normal.  Musculoskeletal:       Left ankle: She exhibits normal range of motion and no swelling. Tenderness. AITFL (when she applies body weight to foot, not with light palpation) tenderness found. No lateral malleolus, no medial malleolus, no posterior TFL, no head of 5th metatarsal and no proximal fibula tenderness found. Achilles tendon normal. Achilles tendon exhibits normal Thompson's test results.  Left foot: There is tenderness. There is normal range of motion, normal capillary refill and no laceration.       Feet:  Neurological: She is alert and oriented to person, place, and time. Gait (not wanting to put full weight on left foot) abnormal.  Sensation of BLE intact.  Skin: Skin is warm and dry.  Psychiatric: She has a normal mood and affect.  Vitals reviewed.  Dg Foot Complete Left  Result Date: 12/14/2017 CLINICAL DATA:  Pain EXAM: LEFT FOOT - COMPLETE 3+ VIEW COMPARISON:  None. FINDINGS: Frontal, oblique, and lateral views were obtained. No evident fracture or dislocation. There is no appreciable joint space narrowing or erosion. There are accessory  ossicles lateral to the cuboid. There is a small bone island in the medial cuboid bone. IMPRESSION: No fracture or dislocation.  No evident arthropathy. Electronically Signed   By: Bretta Bang III M.D.   On: 12/14/2017 14:21     Assessment and Plan :  1. Left foot pain - DG Foot Complete Left; Future - Apply ASO ankle 2. Sprain of anterior talofibular ligament of left ankle, initial encounter History and physical exam is consistent with sprain.  Recommend RICE principles.  Given education material for sports rehab exercises.  Given strict return precautions.  Follow-up as needed.  Meds ordered this encounter  Medications  . naproxen (NAPROSYN) 500 MG tablet    Sig: Take 1 tablet (500 mg total) by mouth 2 (two) times daily with a meal.    Dispense:  30 tablet    Refill:  0    Order Specific Question:   Supervising Provider    Answer:   Ethelda Chick [[id number]]    Side effects, risks, benefits, and alternatives of the medications and treatment plan prescribed today were discussed, and patient expressed understanding of the instructions given. No barriers to understanding were identified. Red flags discussed in detail. Pt expressed understanding regarding what to do in case of emergency/urgent symptoms.     Benjiman Core PA-C  [facility] at [facility] 12/14/2017 2:25 PM

## 2017-12-14 NOTE — Addendum Note (Signed)
Addended by: Sueanne MargaritaBLACKWELL, BELINDA on: 12/14/2017 03:28 PM   Modules accepted: Orders

## 2017-12-14 NOTE — Patient Instructions (Addendum)
This is most consistent with history.  I recommend compression, ice, rest, and anti-inflammatories for the next few days to week.  You can start applying weight to the foot as tolerated.  Keep brace on until swelling goes away.  Ice at least 4-5 times a day for 20 minutes at time.  Take anti-inflammatories consistently for the next 5 days.  Below are some exercises to start as you are pain lessens.  Please start performing as tolerated.  If any of your symptoms worsen or you develop new fever, redness, warmth, or no full improvement after 1 to 2 weeks, please return office for reevaluation.  on Ankle Sprain, Phase I Rehab Ask your health care provider which exercises are safe for you. Do exercises exactly as told by your health care provider and adjust them as directed. It is normal to feel mild stretching, pulling, tightness, or discomfort as you do these exercises, but you should stop right away if you feel sudden pain or your pain gets worse.Do not begin these exercises until told by your health care provider. Stretching and range of motion exercises These exercises warm up your muscles and joints and improve the movement and flexibility of your lower leg and ankle. These exercises also help to relieve pain and stiffness. Exercise A: Gastroc and soleus stretch  1. Sit on the floor with your left / right leg extended. 2. Loop a belt or towel around the ball of your left / right foot. The ball of your foot is on the walking surface, right under your toes. 3. Keep your left / right ankle and foot relaxed and keep your knee straight while you use the belt or towel to pull your foot toward you. You should feel a gentle stretch behind your calf or knee. 4. Hold this position for __________ seconds, then release to the starting position. Repeat the exercise with your knee bent. You can put a pillow or a rolled bath towel under your knee to support it. You should feel a stretch deep in your calf or at your  Achilles tendon. Repeat each stretch __________ times. Complete these stretches __________ times a day. Exercise B: Ankle alphabet  1. Sit with your left / right leg supported at the lower leg. ? Do not rest your foot on anything. ? Make sure your foot has room to move freely. 2. Think of your left / right foot as a paintbrush, and move your foot to trace each letter of the alphabet in the air. Keep your hip and knee still while you trace. Make the letters as large as you can without feeling discomfort. 3. Trace every letter from A to Z. Repeat __________ times. Complete this exercise __________ times a day. Strengthening exercises These exercises build strength and endurance in your ankle and lower leg. Endurance is the ability to use your muscles for a long time, even after they get tired. Exercise C: Dorsiflexors  1. Secure a rubber exercise band or tube to an object, such as a table leg, that will stay still when the band is pulled. Secure the other end around your left / right foot. 2. Sit on the floor facing the object, with your left / right leg extended. The band or tube should be slightly tense when your foot is relaxed. 3. Slowly bring your foot toward you, pulling the band tighter. 4. Hold this position for __________ seconds. 5. Slowly return your foot to the starting position. Repeat __________ times. Complete this exercise __________  times a day. Exercise D: Plantar flexors  1. Sit on the floor with your left / right leg extended. 2. Loop a rubber exercise tube or band around the ball of your left / right foot. The ball of your foot is on the walking surface, right under your toes. ? Hold the ends of the band or tube in your hands. ? The band or tube should be slightly tense when your foot is relaxed. 3. Slowly point your foot and toes downward, pushing them away from you. 4. Hold this position for __________ seconds. 5. Slowly return your foot to the starting  position. Repeat __________ times. Complete this exercise __________ times a day. Exercise E: Evertors 1. Sit on the floor with your legs straight out in front of you. 2. Loop a rubber exercise band or tube around the ball of your left / right foot. The ball of your foot is on the walking surface, right under your toes. ? Hold the ends of the band in your hands, or secure the band to a stable object. ? The band or tube should be slightly tense when your foot is relaxed. 3. Slowly push your foot outward, away from your other leg. 4. Hold this position for __________ seconds. 5. Slowly return your foot to the starting position. Repeat __________ times. Complete this exercise __________ times a day. This information is not intended to replace advice given to you by your health care provider. Make sure you discuss any questions you have with your health care provider. Document Released: 12/16/2004 Document Revised: 01/22/2016 Document Reviewed: 03/31/2015 Elsevier Interactive Patient Education  2018 [employer].   IF you received an x-ray today, you will receive an invoice from [facility]. Please contact [facility] at [phone] with questions or concerns regarding your invoice.   IF you received labwork today, you will receive an invoice from [place]. Please contact [facility] at [phone] with questions or concerns regarding your invoice.   Our billing staff will not be able to assist you with questions regarding bills from these companies.  You will be contacted with the lab results as soon as they are available. The fastest way to get your results is to activate your My Chart account. Instructions are located on the last page of this paperwork. If you have not heard from [place] regarding the results in 2 weeks, please contact this office.

## 2017-12-19 ENCOUNTER — Ambulatory Visit: Payer: Self-pay

## 2017-12-19 NOTE — Telephone Encounter (Signed)
Patient called in with c/o "unable to walk on foot." She says "I saw [place] last week and have been doing everything she said to do. I wasn't able to take the Naproxen, so I've been taking Ibuprofen. I am still not able to walk on my foot and I noticed some swelling from the back of my ankle to the pinky toe underneath. It's also redness." I asked about other symptoms, she denies. Appointment scheduled for tomorrow at 1520 with [name], [facility], care advice given, she verbalized understanding.  Reason for Disposition . [1] Very swollen joint AND [2] no fever  Answer Assessment - Initial Assessment Questions 1. LOCATION: "Which joint is swollen?"     Back of left ankle 2. ONSET: "When did the swelling start?"     The weekend 3. SIZE: "How large is the swelling?"     Not that large 4. PAIN: "Is there any pain?" If so, ask: "How bad is it?" (Scale 1-10; or mild, moderate, severe)     If I try to stand 5. CAUSE: "What do you think caused the swollen joint?"     Injury, maybe tendonitis 6. OTHER SYMPTOMS: "Do you have any other symptoms?" (e.g., fever, chest pain, difficulty breathing, calf pain)     Redness to the ankle 7. PREGNANCY: "Is there any chance you are pregnant?" "When was your last menstrual period?"     No  Protocols used: ANKLE SWELLING-A-AH

## 2017-12-20 ENCOUNTER — Other Ambulatory Visit: Payer: Self-pay

## 2017-12-20 ENCOUNTER — Ambulatory Visit (INDEPENDENT_AMBULATORY_CARE_PROVIDER_SITE_OTHER): Payer: 59 | Admitting: Physician Assistant

## 2017-12-20 ENCOUNTER — Encounter: Payer: Self-pay | Admitting: Physician Assistant

## 2017-12-20 ENCOUNTER — Telehealth: Payer: Self-pay | Admitting: Physician Assistant

## 2017-12-20 VITALS — BP 109/71 | HR 70 | Temp 98.3°F | Resp 17 | Ht 65.83 in | Wt 152.4 lb

## 2017-12-20 DIAGNOSIS — S93492D Sprain of other ligament of left ankle, subsequent encounter: Secondary | ICD-10-CM | POA: Diagnosis not present

## 2017-12-20 DIAGNOSIS — M79672 Pain in left foot: Secondary | ICD-10-CM

## 2017-12-20 LAB — POCT CBC
Granulocyte percent: 69.4 %G (ref 37–80)
HCT, POC: 41.1 % (ref 37.7–47.9)
Hemoglobin: 13.4 g/dL (ref 12.2–16.2)
Lymph, poc: 1.7 (ref 0.6–3.4)
MCH, POC: 29.6 pg (ref 27–31.2)
MCHC: 32.6 g/dL (ref 31.8–35.4)
MCV: 90.8 fL (ref 80–97)
MID (cbc): 0.2 (ref 0–0.9)
MPV: 7.6 fL (ref 0–99.8)
POC Granulocyte: 4.4 (ref 2–6.9)
POC LYMPH PERCENT: 26.8 %L (ref 10–50)
POC MID %: 3.8 %M (ref 0–12)
Platelet Count, POC: 295 10*3/uL (ref 142–424)
RBC: 4.53 M/uL (ref 4.04–5.48)
RDW, POC: 13.7 %
WBC: 6.4 10*3/uL (ref 4.6–10.2)

## 2017-12-20 MED ORDER — DICLOFENAC SODIUM 75 MG PO TBEC: 75.0000 mg | DELAYED_RELEASE_TABLET | Freq: Two times a day (BID) | ORAL | 0 refills | Status: DC

## 2017-12-20 NOTE — Patient Instructions (Addendum)
Try voltaren for pain. If this does not work, can continue with ibuprofen. Continue icing, compressing, and elevating. Continue exercises as tolerated and start ambulating as tolerated. Follow up with me in one week. If any symptoms worsen or you develop new concerning symptoms, seek care immediately.     IF you received an x-ray today, you will receive an invoice from [facility]. Please contact [facility] at [phone] with questions or concerns regarding your invoice.   IF you received labwork today, you will receive an invoice from [facility]. Please contact [facility] at [phone] with questions or concerns regarding your invoice.   Our billing staff will not be able to assist you with questions regarding bills from these companies.  You will be contacted with the lab results as soon as they are available. The fastest way to get your results is to activate your My Chart account. Instructions are located on the last page of this paperwork. If you have not heard from us regarding the results in 2 weeks, please contact this office.     Ankle Sprain, Phase I Rehab Ask your health care provider which exercises are safe for you. Do exercises exactly as told by your health care provider and adjust them as directed. It is normal to feel mild stretching, pulling, tightness, or discomfort as you do these exercises, but you should stop right away if you feel sudden pain or your pain gets worse.Do not begin these exercises until told by your health care provider. Stretching and range of motion exercises These exercises warm up your muscles and joints and improve the movement and flexibility of your lower leg and ankle. These exercises also help to relieve pain and stiffness. Exercise A: Gastroc and soleus stretch  1. Sit on the floor with your left / right leg extended. 2. Loop a belt or towel around the ball of your left / right foot. The ball of your foot is on the walking  surface, right under your toes. 3. Keep your left / right ankle and foot relaxed and keep your knee straight while you use the belt or towel to pull your foot toward you. You should feel a gentle stretch behind your calf or knee. 4. Hold this position for __________ seconds, then release to the starting position. Repeat the exercise with your knee bent. You can put a pillow or a rolled bath towel under your knee to support it. You should feel a stretch deep in your calf or at your Achilles tendon. Repeat each stretch __________ times. Complete these stretches __________ times a day. Exercise B: Ankle alphabet  1. Sit with your left / right leg supported at the lower leg. ? Do not rest your foot on anything. ? Make sure your foot has room to move freely. 2. Think of your left / right foot as a paintbrush, and move your foot to trace each letter of the alphabet in the air. Keep your hip and knee still while you trace. Make the letters as large as you can without feeling discomfort. 3. Trace every letter from A to Z. Repeat __________ times. Complete this exercise __________ times a day. Strengthening exercises These exercises build strength and endurance in your ankle and lower leg. Endurance is the ability to use your muscles for a long time, even after they get tired. Exercise C: Dorsiflexors  1. Secure a rubber exercise band or tube to an object, such as a table leg, that will stay still when the band is  pulled. Secure the other end around your left / right foot. 2. Sit on the floor facing the object, with your left / right leg extended. The band or tube should be slightly tense when your foot is relaxed. 3. Slowly bring your foot toward you, pulling the band tighter. 4. Hold this position for __________ seconds. 5. Slowly return your foot to the starting position. Repeat __________ times. Complete this exercise __________ times a day. Exercise D: Plantar flexors  1. Sit on the floor with  your left / right leg extended. 2. Loop a rubber exercise tube or band around the ball of your left / right foot. The ball of your foot is on the walking surface, right under your toes. ? Hold the ends of the band or tube in your hands. ? The band or tube should be slightly tense when your foot is relaxed. 3. Slowly point your foot and toes downward, pushing them away from you. 4. Hold this position for __________ seconds. 5. Slowly return your foot to the starting position. Repeat __________ times. Complete this exercise __________ times a day. Exercise E: Evertors 1. Sit on the floor with your legs straight out in front of you. 2. Loop a rubber exercise band or tube around the ball of your left / right foot. The ball of your foot is on the walking surface, right under your toes. ? Hold the ends of the band in your hands, or secure the band to a stable object. ? The band or tube should be slightly tense when your foot is relaxed. 3. Slowly push your foot outward, away from your other leg. 4. Hold this position for __________ seconds. 5. Slowly return your foot to the starting position. Repeat __________ times. Complete this exercise __________ times a day. This information is not intended to replace advice given to you by your health care provider. Make sure you discuss any questions you have with your health care provider. Document Released: 12/16/2004 Document Revised: 01/22/2016 Document Reviewed: 03/31/2015 Elsevier Interactive Patient Education  2018 [employer].

## 2017-12-20 NOTE — Progress Notes (Signed)
Sierra Foster  MRN: [id number] DOB: 02-Dec-1980  Subjective:  Sierra Foster is a 37 y.o. female seen in office today for a chief complaint of follow up on left foot pain. Pt seen on 12/14/17 and dx with left ankle sprain. Rec rest, ice, nsaids,ASO, and elevation. Start ROM exercises as tolerated. Today, notes she is about 60% better. It did get worse a few days after she was seen here and had some overlying redness. Naproxen was not working so started ibuprofen, which did help. Has iced, elevated, and rested as educated to do so. Just started doing ROM exercises yesterday. 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 Thinks she can go back Monday. Has some minimal bruising. Denies redness, warmth, numbness, tingling, fever, and chills. No other questions or concerns.   Review of Systems Per HPI  Patient Active Problem List   Diagnosis Date Noted  . Perennial allergic rhinosinusitis with a nonallergic component 12/25/2015    Current Outpatient Medications on File Prior to Visit  Medication Sig Dispense Refill  . cetirizine (ZYRTEC) 10 MG tablet Take 10 mg by mouth daily.    Marland Kitchen ketotifen (ZADITOR) 0.025 % ophthalmic solution 1 drop 2 (two) times daily.    . Azelastine-Fluticasone (DYMISTA) 137-50 MCG/ACT SUSP Place 2 sprays into both nostrils 1 day or 1 dose. (Patient not taking: Reported on 12/20/2017) 1 Bottle 5  . ciprofloxacin-dexamethasone (CIPRODEX) otic suspension 4 drops 2 (two) times daily.    Marland Kitchen dextromethorphan-guaiFENesin (ROBITUSSIN-DM) 10-100 MG/5ML liquid Take by mouth every 4 (four) hours as needed for cough.    . folic acid (FOLVITE) 1 MG tablet Take 1 mg by mouth daily.    Marland Kitchen ipratropium (ATROVENT) 0.03 % nasal spray Place 2 sprays into the nose 4 (four) times daily. (Patient not taking: Reported on 12/14/2017) 30 mL 1  . levocetirizine (XYZAL) 5 MG tablet Take 1 tablet (5 mg total) by mouth every evening. (Patient not taking: Reported on 12/14/2017) 30 tablet 5  . naproxen  (NAPROSYN) 500 MG tablet Take 1 tablet (500 mg total) by mouth 2 (two) times daily with a meal. (Patient not taking: Reported on 12/20/2017) 30 tablet 0  . Prenatal Vit-Fe Fumarate-FA (PRENATAL MULTIVITAMIN) TABS tablet Take 1 tablet by mouth daily at 12 noon.     No current facility-administered medications on file prior to visit.     Allergies  Allergen Reactions  . Penicillins      Objective:  BP 109/71 (BP Location: Right Arm, Patient Position: Sitting, Cuff Size: Normal)   Pulse 70   Temp 98.3 F (36.8 C) (Oral)   Resp 17   Ht 5' 5.83" (1.672 m)   Wt 152 lb 6.4 oz (69.1 kg)   LMP 11/26/2017 (Exact Date)   SpO2 99%   BMI 24.73 kg/m   Physical Exam  Constitutional: She is oriented to person, place, and time. She appears well-developed and well-nourished.  HENT:  Head: Normocephalic and atraumatic.  Eyes: Conjunctivae are normal.  Neck: Normal range of motion.  Cardiovascular:  Pulses:      Dorsalis pedis pulses are 2+ on the right side, and 2+ on the left side.       Posterior tibial pulses are 2+ on the right side, and 2+ on the left side.  Pulmonary/Chest: Effort normal.  Musculoskeletal:       Left ankle: She exhibits ecchymosis (mild at AlTFL  ). She exhibits normal range of motion (no tenderness noted with ROM in all  planes), no swelling and normal pulse. Tenderness (with deep palpation). AITFL tenderness found. No lateral malleolus, no medial malleolus, no CF ligament, no posterior TFL, no head of 5th metatarsal and no proximal fibula tenderness found. Achilles tendon normal.       Left foot: There is no bony tenderness and normal capillary refill.  Neurological: She is alert and oriented to person, place, and time.  Sensation of BLE intact. Strength of BLE 5/5.   Skin: Skin is warm and dry.  No overlying erythema or warmth of left ankle or foot.   Psychiatric: She has a normal mood and affect.  Vitals reviewed.    Results for orders placed or performed in  visit on 12/20/17 (from the past 24 hour(s))  POCT CBC     Status: None   Collection Time: 12/20/17  4:03 PM  Result Value Ref Range   WBC 6.4 4.6 - 10.2 K/uL   Lymph, poc 1.7 0.6 - 3.4   POC LYMPH PERCENT 26.8 10 - 50 %L   MID (cbc) 0.2 0 - 0.9   POC MID % 3.8 0 - 12 %M   POC Granulocyte 4.4 2 - 6.9   Granulocyte percent 69.4 37 - 80 %G   RBC 4.53 4.04 - 5.48 M/uL   Hemoglobin 13.4 12.2 - 16.2 g/dL   HCT, POC 09.841.1 11.937.7 - 47.9 %   MCV 90.8 80 - 97 fL   MCH, POC 29.6 27 - 31.2 pg   MCHC 32.6 31.8 - 35.4 g/dL   RDW, POC 14.713.7 %   Platelet Count, POC 295 142 - 424 K/uL   MPV 7.6 0 - 99.8 fL    Assessment and Plan :  1. Sprain of anterior talofibular ligament of left ankle, subsequent encounter Pain improving which is reassuring.Swelling has resolved.  No overlying redness or warmth noted. However, pt does not that one day over the weekend it was red and hot, will check labs for further evaluation. Try voltaren for pain. May work better than ibuprofen. Continue ice/elevating as needed. Advance exercises as tolerated. May return to work on Monday. Would like pt to f/u on Tuesday to ensure she tolerates going back to work okay. Work forms completed.  - diclofenac (VOLTAREN) 75 MG EC tablet; Take 1 tablet (75 mg total) by mouth 2 (two) times daily.  Dispense: 30 tablet; Refill: 0  2. Left foot pain - POCT CBC - C-reactive protein - Sedimentation Rate - Uric Acid    [name] PA-C  [facility] at [facility]  [facility] 12/20/2017 4:05 PM

## 2017-12-20 NOTE — Telephone Encounter (Signed)
Completed and placed in FMLA box.  

## 2017-12-20 NOTE — Telephone Encounter (Signed)
Patient needs FMLA forms completed by [place] for her foor injury. I have completed what I could from the OV notes and highlighted the ares I was not sure about. The patient does have an apt today at 3:20 with [employer]. I will place the forms in Brittany's box on 12/20/17 please return to the FMLA/Disability desk within 5-7 business days. Thank you!

## 2017-12-21 LAB — URIC ACID: Uric Acid: 4.8 mg/dL (ref 2.5–7.1)

## 2017-12-21 LAB — C-REACTIVE PROTEIN: CRP: 5 mg/L (ref 0–10)

## 2017-12-21 LAB — SEDIMENTATION RATE: Sed Rate: 2 mm/hr (ref 0–32)

## 2017-12-21 MED FILL — DICLOFENAC SODIUM 75 MG TAB: 75 | 15 days supply | Qty: 30 | Fill #0

## 2017-12-21 NOTE — Telephone Encounter (Signed)
Forms scanned and faxed on 12/21/17

## 2017-12-22 ENCOUNTER — Encounter: Payer: Self-pay | Admitting: Physician Assistant

## 2017-12-27 ENCOUNTER — Encounter: Payer: Self-pay | Admitting: Physician Assistant

## 2017-12-27 ENCOUNTER — Ambulatory Visit (INDEPENDENT_AMBULATORY_CARE_PROVIDER_SITE_OTHER): Payer: 59 | Admitting: Physician Assistant

## 2017-12-27 ENCOUNTER — Other Ambulatory Visit: Payer: Self-pay

## 2017-12-27 VITALS — BP 120/84 | HR 62 | Temp 99.2°F | Resp 20 | Ht 64.84 in | Wt 151.0 lb

## 2017-12-27 DIAGNOSIS — S93492D Sprain of other ligament of left ankle, subsequent encounter: Secondary | ICD-10-CM | POA: Diagnosis not present

## 2017-12-27 NOTE — Progress Notes (Signed)
Sierra Foster  MRN: [id number] DOB: 12/17/1980  Subjective:  Sierra Foster is a 37 y.o. female seen in office today for a chief complaint of follow up on left ankle sprain. Pt last seen on 12/20/17. Returned to work today. Encouraged to f/u after returning to work to see if she needs any restrictions. 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 Did well at work but did notice some irritation after being on her feet for over one hour at a time. She would take a break, sit, and elevate her foot with relief. Could not wear the ASO brace to work because it was so thick and did not fit in her shoe. Has tried diclofenac, with relief. Has continued doing ROM exercises.    Denies redness, warmth, numbness, tingling, fever, and chills. No other questions or concerns.   Review of Systems  Per HPI  Patient Active Problem List   Diagnosis Date Noted  . Perennial allergic rhinosinusitis with a nonallergic component 12/25/2015    Current Outpatient Medications on File Prior to Visit  Medication Sig Dispense Refill  . cetirizine (ZYRTEC) 10 MG tablet Take 10 mg by mouth daily.    . ciprofloxacin-dexamethasone (CIPRODEX) otic suspension 4 drops 2 (two) times daily.    Marland Kitchen dextromethorphan-guaiFENesin (ROBITUSSIN-DM) 10-100 MG/5ML liquid Take by mouth every 4 (four) hours as needed for cough.    . diclofenac (VOLTAREN) 75 MG EC tablet Take 1 tablet (75 mg total) by mouth 2 (two) times daily. 30 tablet 0  . folic acid (FOLVITE) 1 MG tablet Take 1 mg by mouth daily.    Marland Kitchen ketotifen (ZADITOR) 0.025 % ophthalmic solution 1 drop 2 (two) times daily.    . Prenatal Vit-Fe Fumarate-FA (PRENATAL MULTIVITAMIN) TABS tablet Take 1 tablet by mouth daily at 12 noon.    . Azelastine-Fluticasone (DYMISTA) 137-50 MCG/ACT SUSP Place 2 sprays into both nostrils 1 day or 1 dose. (Patient not taking: Reported on 12/20/2017) 1 Bottle 5  . ipratropium (ATROVENT) 0.03 % nasal spray Place 2 sprays  into the nose 4 (four) times daily. (Patient not taking: Reported on 12/14/2017) 30 mL 1  . levocetirizine (XYZAL) 5 MG tablet Take 1 tablet (5 mg total) by mouth every evening. (Patient not taking: Reported on 12/14/2017) 30 tablet 5  . naproxen (NAPROSYN) 500 MG tablet Take 1 tablet (500 mg total) by mouth 2 (two) times daily with a meal. (Patient not taking: Reported on 12/20/2017) 30 tablet 0   No current facility-administered medications on file prior to visit.     Allergies  Allergen Reactions  . Penicillins      Objective:  Pulse 62   Temp 99.2 F (37.3 C) (Oral)   Resp 20   Ht 5' 4.84" (1.647 m)   Wt 151 lb (68.5 kg)   SpO2 98%   BMI 25.25 kg/m   Physical Exam  Constitutional: She is oriented to person, place, and time. She appears well-developed and well-nourished. No distress.  HENT:  Head: Normocephalic and atraumatic.  Eyes: Conjunctivae are normal.  Neck: Normal range of motion.  Pulmonary/Chest: Effort normal.  Musculoskeletal:       Left ankle: She exhibits normal range of motion, no swelling, no ecchymosis and normal pulse. Tenderness (mild at ATFL). Achilles tendon normal.  Neurological: She is alert and oriented to person, place, and time.  Skin: Skin is warm and dry.  Psychiatric: She has a normal mood and affect.  Vitals reviewed.   Assessment and Plan :  1. Sprain of anterior talofibular ligament of left ankle, subsequent encounter Continues to improve. Given work restrictions of taking a 5-10 minute break after being on her feet continuously for >1 hr at a time beginning today and ending in one week. Given ACE bandage to use while at work. Advised to return to clinic if symptoms worsen, do not improve in 2 weeks, or as needed. Consider referral to sports med if no improvement at that time.     [name] PA-C  Primary Care at [facility]  [facility] 12/27/2017 6:42 PM

## 2017-12-27 NOTE — Patient Instructions (Addendum)
Continue exercises as you are. Use brace at work as needed and take recommended breaks. Once you are pain free for at least 3-5 days, you can return to exercise. Advance exercises slowly! If still having pain in 2 weeks, return here. Thank you for letting me participate in your health and well being.    IF you received an x-ray today, you will receive an invoice from [facility]. Please contact [facility] at [phone] with questions or concerns regarding your invoice.   IF you received labwork today, you will receive an invoice from [facility]. Please contact [facility] at [phone] with questions or concerns regarding your invoice.   Our billing staff will not be able to assist you with questions regarding bills from these companies.  You will be contacted with the lab results as soon as they are available. The fastest way to get your results is to activate your My Chart account. Instructions are located on the last page of this paperwork. If you have not heard from us regarding the results in 2 weeks, please contact this office.

## 2018-02-22 ENCOUNTER — Encounter: Payer: Self-pay | Admitting: Family

## 2018-02-22 ENCOUNTER — Ambulatory Visit: Payer: Self-pay | Admitting: Family

## 2018-02-22 VITALS — BP 110/58 | HR 101 | Temp 99.2°F | Wt 153.0 lb

## 2018-02-22 DIAGNOSIS — J029 Acute pharyngitis, unspecified: Secondary | ICD-10-CM

## 2018-02-22 LAB — POCT RAPID STREP A (OFFICE): Rapid Strep A Screen: NEGATIVE

## 2018-02-22 NOTE — Patient Instructions (Signed)

## 2018-02-22 NOTE — Progress Notes (Signed)
Subjective:     Patient ID: Sierra Foster, female   DOB: 1981-01-13, 37 y.o.   MRN: [id number]  HPI 37 year old female is in today with c/o sore throat x 2 days with mild cough and nasal congestion. She is concerned about strep throat because a few coworkers have had it. No fever or chills.  Review of Systems  Constitutional: Negative.   HENT: Positive for congestion, postnasal drip and sore throat.   Respiratory: Negative.   Cardiovascular: Negative.   Endocrine: Negative.   Musculoskeletal: Negative.   Allergic/Immunologic: Negative.   Hematological: Negative.    No past medical history on file.  Social History   Socioeconomic History  . Marital status: Married    Spouse name: Not on file  . Number of children: 0  . Years of education: Not on file  . Highest education level: Not on file  Occupational History  . Not on file  Social Needs  . Financial resource strain: Not on file  . Food insecurity:    Worry: Not on file    Inability: Not on file  . Transportation needs:    Medical: Not on file    Non-medical: Not on file  Tobacco Use  . Smoking status: Never Smoker  . Smokeless tobacco: Never Used  Substance and Sexual Activity  . Alcohol use: Yes    Comment: occasional  . Drug use: No  . Sexual activity: Yes  Lifestyle  . Physical activity:    Days per week: Not on file    Minutes per session: Not on file  . Stress: Not on file  Relationships  . Social connections:    Talks on phone: Not on file    Gets together: Not on file    Attends religious service: Not on file    Active member of club or organization: Not on file    Attends meetings of clubs or organizations: Not on file    Relationship status: Not on file  . Intimate partner violence:    Fear of current or ex partner: Not on file    Emotionally abused: Not on file    Physically abused: Not on file    Forced sexual activity: Not on file  Other Topics Concern  . Not on file  Social History  Narrative  . Not on file    Past Surgical History:  Procedure Laterality Date  . APPENDECTOMY  1998    Family History  Problem Relation Age of Onset  . Allergic rhinitis Father   . Hypertension Father   . Hyperlipidemia Father   . Allergic rhinitis Sister   . Asthma Neg Hx   . Eczema Neg Hx   . Urticaria Neg Hx   . Immunodeficiency Neg Hx   . Atopy Neg Hx   . Angioedema Neg Hx     Allergies  Allergen Reactions  . Penicillins     Current Outpatient Medications on File Prior to Visit  Medication Sig Dispense Refill  . Azelastine-Fluticasone (DYMISTA) 137-50 MCG/ACT SUSP Place 2 sprays into both nostrils 1 day or 1 dose. (Patient not taking: Reported on 12/20/2017) 1 Bottle 5  . cetirizine (ZYRTEC) 10 MG tablet Take 10 mg by mouth daily.    . ciprofloxacin-dexamethasone (CIPRODEX) otic suspension 4 drops 2 (two) times daily.    Marland Kitchen dextromethorphan-guaiFENesin (ROBITUSSIN-DM) 10-100 MG/5ML liquid Take by mouth every 4 (four) hours as needed for cough.    . diclofenac (VOLTAREN) 75 MG EC tablet Take 1 tablet (  75 mg total) by mouth 2 (two) times daily. 30 tablet 0  . folic acid (FOLVITE) 1 MG tablet Take 1 mg by mouth daily.    Marland Kitchen ipratropium (ATROVENT) 0.03 % nasal spray Place 2 sprays into the nose 4 (four) times daily. (Patient not taking: Reported on 12/14/2017) 30 mL 1  . ketotifen (ZADITOR) 0.025 % ophthalmic solution 1 drop 2 (two) times daily.    Marland Kitchen levocetirizine (XYZAL) 5 MG tablet Take 1 tablet (5 mg total) by mouth every evening. (Patient not taking: Reported on 12/14/2017) 30 tablet 5  . naproxen (NAPROSYN) 500 MG tablet Take 1 tablet (500 mg total) by mouth 2 (two) times daily with a meal. (Patient not taking: Reported on 12/20/2017) 30 tablet 0  . Prenatal Vit-Fe Fumarate-FA (PRENATAL MULTIVITAMIN) TABS tablet Take 1 tablet by mouth daily at 12 noon.     No current facility-administered medications on file prior to visit.     BP (!) 110/58   Pulse (!) 101   Temp 99.2  F (37.3 C)   Wt 153 lb (69.4 kg)   SpO2 98%   BMI 25.58 kg/m chart    Objective:   Physical Exam  Constitutional: She is oriented to person, place, and time. She appears well-developed and well-nourished.  HENT:  Right Ear: External ear normal.  Left Ear: External ear normal.  Mouth/Throat: No oropharyngeal exudate.  Pharynx moderately red. No swelling or exudate  Neck: Normal range of motion.  Cardiovascular: Normal rate, regular rhythm and normal heart sounds.  Pulmonary/Chest: Effort normal and breath sounds normal.  Musculoskeletal: Normal range of motion.  Neurological: She is alert and oriented to person, place, and time.  Skin: Skin is warm and dry.       Assessment:     Sierra Foster was seen today for sore throat and headache.  Diagnoses and all orders for this visit:  Sore throat -     POCT rapid strep A  Pharyngitis, unspecified etiology      Plan:     Change Zyrtec to Zyrtec D twice a day x 7 days then resume Zyrtec. Call if symptoms worsen or persist.

## 2018-02-24 ENCOUNTER — Telehealth: Payer: Self-pay

## 2018-02-24 NOTE — Telephone Encounter (Signed)
Patient did not answered the phone, I left a message asking to call us back.  

## 2018-04-12 ENCOUNTER — Ambulatory Visit: Payer: Self-pay | Admitting: Family Medicine

## 2018-04-12 VITALS — BP 110/80 | HR 84 | Temp 98.8°F | Resp 18

## 2018-04-12 DIAGNOSIS — R6889 Other general symptoms and signs: Secondary | ICD-10-CM

## 2018-04-12 DIAGNOSIS — R05 Cough: Secondary | ICD-10-CM

## 2018-04-12 DIAGNOSIS — J069 Acute upper respiratory infection, unspecified: Secondary | ICD-10-CM

## 2018-04-12 DIAGNOSIS — R059 Cough, unspecified: Secondary | ICD-10-CM

## 2018-04-12 LAB — POCT INFLUENZA A/B
Influenza A, POC: NEGATIVE
Influenza B, POC: NEGATIVE

## 2018-04-12 MED ORDER — DOXYCYCLINE HYCLATE 100 MG PO TABS: 100.0000 mg | ORAL_TABLET | Freq: Two times a day (BID) | ORAL | 0 refills | Status: DC

## 2018-04-12 MED ORDER — DOXYCYCLINE HYCLATE 100 MG PO TABS: 100.0000 mg | ORAL_TABLET | Freq: Two times a day (BID) | ORAL | 0 refills | Status: AC

## 2018-04-12 NOTE — Patient Instructions (Signed)
Upper Respiratory Infection, Adult Most upper respiratory infections (URIs) are caused by a virus. A URI affects the nose, throat, and upper air passages. The most common type of URI is often called "the common cold." Follow these instructions at home:  Take medicines only as told by your doctor.  Gargle warm saltwater or take cough drops to comfort your throat as told by your doctor.  Use a warm mist humidifier or inhale steam from a shower to increase air moisture. This may make it easier to breathe.  Drink enough fluid to keep your pee (urine) clear or pale yellow.  Eat soups and other clear broths.  Have a healthy diet.  Rest as needed.  Go back to work when your fever is gone or your doctor says it is okay. ? You may need to stay home longer to avoid giving your URI to others. ? You can also wear a face mask and wash your hands often to prevent spread of the virus.  Use your inhaler more if you have asthma.  Do not use any tobacco products, including cigarettes, chewing tobacco, or electronic cigarettes. If you need help quitting, ask your doctor. Contact a doctor if:  You are getting worse, not better.  Your symptoms are not helped by medicine.  You have chills.  You are getting more short of breath.  You have brown or red mucus.  You have yellow or brown discharge from your nose.  You have pain in your face, especially when you bend forward.  You have a fever.  You have puffy (swollen) neck glands.  You have pain while swallowing.  You have white areas in the back of your throat. Get help right away if:  You have very bad or constant: ? Headache. ? Ear pain. ? Pain in your forehead, behind your eyes, and over your cheekbones (sinus pain). ? Chest pain.  You have long-lasting (chronic) lung disease and any of the following: ? Wheezing. ? Long-lasting cough. ? Coughing up blood. ? A change in your usual mucus.  You have a stiff neck.  You have  changes in your: ? Vision. ? Hearing. ? Thinking. ? Mood. This information is not intended to replace advice given to you by your health care provider. Make sure you discuss any questions you have with your health care provider. Document Released: 11/03/2007 Document Revised: 01/18/2016 Document Reviewed: 08/22/2013 Elsevier Interactive Patient Education  2018 [employer]  

## 2018-04-12 NOTE — Progress Notes (Signed)
Sierra Foster is a 37 y.o. female who presents today with concerns of a cough for the last 4 days. She reports new onset chills and maliase and is requesting flu test. She does do direct patient care. She has trialed cough medications with only some relief.  Review of Systems  Constitutional: Positive for chills, fever and malaise/fatigue.  HENT: Positive for sore throat. Negative for congestion, ear discharge, ear pain and sinus pain.   Eyes: Negative.   Respiratory: Positive for cough. Negative for sputum production and shortness of breath.   Cardiovascular: Negative.  Negative for chest pain.  Gastrointestinal: Negative for abdominal pain, diarrhea, nausea and vomiting.  Genitourinary: Negative for dysuria, frequency, hematuria and urgency.  Musculoskeletal: Negative for myalgias.  Skin: Negative.   Neurological: Negative for headaches.  Endo/Heme/Allergies: Negative.   Psychiatric/Behavioral: Negative.     O: Vitals:   04/12/18 1401  BP: 110/80  Pulse: 84  Resp: 18  Temp: 98.8 F (37.1 C)  SpO2: 98%     Physical Exam  Constitutional: She is oriented to person, place, and time. Vital signs are normal. She appears well-developed and well-nourished. She is active.  Non-toxic appearance. She does not have a sickly appearance.  HENT:  Head: Normocephalic.  Right Ear: Hearing, external ear and ear canal normal.  Left Ear: Hearing, external ear and ear canal normal.  Nose: Nose normal. Right sinus exhibits no maxillary sinus tenderness and no frontal sinus tenderness. Left sinus exhibits no maxillary sinus tenderness and no frontal sinus tenderness.  Mouth/Throat: Uvula is midline and oropharynx is clear and moist.  Bilateral cerumen impaction  Neck: Normal range of motion. Neck supple.  Cardiovascular: Normal rate, regular rhythm, normal heart sounds and normal pulses.  Pulmonary/Chest: Effort normal and breath sounds normal. She has no decreased breath sounds. She has no  rhonchi. She has no rales.  CTA all fields- frequent dry cough on exam.  Abdominal: Soft. Bowel sounds are normal.  Musculoskeletal: Normal range of motion.  Lymphadenopathy:       Head (right side): No submental and no submandibular adenopathy present.       Head (left side): No submental and no submandibular adenopathy present.    She has no cervical adenopathy.  Neurological: She is alert and oriented to person, place, and time.  Psychiatric: She has a normal mood and affect.  Vitals reviewed.  A: 1. Upper respiratory tract infection, unspecified type   2. Flu-like symptoms   3. Cough    P: Discussed exam findings, diagnosis etiology and medication use and indications reviewed with patient. Follow- Up and discharge instructions provided. No emergent/urgent issues found on exam.  Patient verbalized understanding of information provided and agrees with plan of care (POC), all questions answered.  Discussed likelihood of patient recovering with symptom management and supportive care (patient is young, healthy, without comorbid condition or risk factors)- patient expresses that she feels that her symptoms are worsening and declined watchful waiting technique and prefers to use an antibiotic at this time.  1. Upper respiratory tract infection, unspecified type - doxycycline (VIBRA-TABS) 100 MG tablet; Take 1 tablet (100 mg total) by mouth 2 (two) times daily for 7 days.  2. Flu-like symptoms - POCT Influenza A/B Results for orders placed or performed in visit on 04/12/18 (from the past 24 hour(s))  POCT Influenza A/B     Status: Normal   Collection Time: 04/12/18  2:21 PM  Result Value Ref Range   Influenza A, POC Negative Negative  Influenza B, POC Negative Negative   3. Cough - doxycycline (VIBRA-TABS) 100 MG tablet; Take 1 tablet (100 mg total) by mouth 2 (two) times daily for 7 days.

## 2018-07-18 DIAGNOSIS — Z01419 Encounter for gynecological examination (general) (routine) without abnormal findings: Secondary | ICD-10-CM | POA: Diagnosis not present

## 2018-07-18 DIAGNOSIS — Z6826 Body mass index (BMI) 26.0-26.9, adult: Secondary | ICD-10-CM | POA: Diagnosis not present

## 2018-08-03 DIAGNOSIS — Z30432 Encounter for removal of intrauterine contraceptive device: Secondary | ICD-10-CM | POA: Insufficient documentation

## 2018-08-03 DIAGNOSIS — Z30430 Encounter for insertion of intrauterine contraceptive device: Secondary | ICD-10-CM | POA: Diagnosis not present

## 2018-08-14 DIAGNOSIS — S61001A Unspecified open wound of right thumb without damage to nail, initial encounter: Secondary | ICD-10-CM | POA: Diagnosis not present

## 2018-09-21 DIAGNOSIS — Z30431 Encounter for routine checking of intrauterine contraceptive device: Secondary | ICD-10-CM | POA: Diagnosis not present

## 2018-12-26 DIAGNOSIS — M9904 Segmental and somatic dysfunction of sacral region: Secondary | ICD-10-CM | POA: Diagnosis not present

## 2018-12-26 DIAGNOSIS — M9903 Segmental and somatic dysfunction of lumbar region: Secondary | ICD-10-CM | POA: Diagnosis not present

## 2018-12-26 DIAGNOSIS — M7918 Myalgia, other site: Secondary | ICD-10-CM | POA: Diagnosis not present

## 2018-12-26 DIAGNOSIS — M545 Low back pain: Secondary | ICD-10-CM | POA: Diagnosis not present

## 2018-12-26 DIAGNOSIS — M9905 Segmental and somatic dysfunction of pelvic region: Secondary | ICD-10-CM | POA: Diagnosis not present

## 2018-12-29 DIAGNOSIS — M9905 Segmental and somatic dysfunction of pelvic region: Secondary | ICD-10-CM | POA: Diagnosis not present

## 2018-12-29 DIAGNOSIS — M545 Low back pain: Secondary | ICD-10-CM | POA: Diagnosis not present

## 2018-12-29 DIAGNOSIS — M9904 Segmental and somatic dysfunction of sacral region: Secondary | ICD-10-CM | POA: Diagnosis not present

## 2018-12-29 DIAGNOSIS — M7918 Myalgia, other site: Secondary | ICD-10-CM | POA: Diagnosis not present

## 2018-12-29 DIAGNOSIS — M9903 Segmental and somatic dysfunction of lumbar region: Secondary | ICD-10-CM | POA: Diagnosis not present

## 2019-01-02 DIAGNOSIS — M9903 Segmental and somatic dysfunction of lumbar region: Secondary | ICD-10-CM | POA: Diagnosis not present

## 2019-01-02 DIAGNOSIS — M545 Low back pain: Secondary | ICD-10-CM | POA: Diagnosis not present

## 2019-01-02 DIAGNOSIS — M9904 Segmental and somatic dysfunction of sacral region: Secondary | ICD-10-CM | POA: Diagnosis not present

## 2019-01-02 DIAGNOSIS — M7918 Myalgia, other site: Secondary | ICD-10-CM | POA: Diagnosis not present

## 2019-01-02 DIAGNOSIS — M9905 Segmental and somatic dysfunction of pelvic region: Secondary | ICD-10-CM | POA: Diagnosis not present

## 2019-01-11 DIAGNOSIS — M545 Low back pain: Secondary | ICD-10-CM | POA: Diagnosis not present

## 2019-01-11 DIAGNOSIS — M7918 Myalgia, other site: Secondary | ICD-10-CM | POA: Diagnosis not present

## 2019-01-11 DIAGNOSIS — M9903 Segmental and somatic dysfunction of lumbar region: Secondary | ICD-10-CM | POA: Diagnosis not present

## 2019-01-11 DIAGNOSIS — M9904 Segmental and somatic dysfunction of sacral region: Secondary | ICD-10-CM | POA: Diagnosis not present

## 2019-01-11 DIAGNOSIS — M9905 Segmental and somatic dysfunction of pelvic region: Secondary | ICD-10-CM | POA: Diagnosis not present

## 2019-01-24 DIAGNOSIS — M7918 Myalgia, other site: Secondary | ICD-10-CM | POA: Diagnosis not present

## 2019-01-24 DIAGNOSIS — M545 Low back pain: Secondary | ICD-10-CM | POA: Diagnosis not present

## 2019-01-24 DIAGNOSIS — M9905 Segmental and somatic dysfunction of pelvic region: Secondary | ICD-10-CM | POA: Diagnosis not present

## 2019-01-24 DIAGNOSIS — M9904 Segmental and somatic dysfunction of sacral region: Secondary | ICD-10-CM | POA: Diagnosis not present

## 2019-01-24 DIAGNOSIS — M9903 Segmental and somatic dysfunction of lumbar region: Secondary | ICD-10-CM | POA: Diagnosis not present

## 2019-07-11 ENCOUNTER — Ambulatory Visit (INDEPENDENT_AMBULATORY_CARE_PROVIDER_SITE_OTHER): Payer: 59 | Admitting: Allergy and Immunology

## 2019-07-11 ENCOUNTER — Other Ambulatory Visit: Payer: Self-pay

## 2019-07-11 ENCOUNTER — Encounter: Payer: Self-pay | Admitting: Allergy and Immunology

## 2019-07-11 DIAGNOSIS — J3089 Other allergic rhinitis: Secondary | ICD-10-CM | POA: Diagnosis not present

## 2019-07-11 DIAGNOSIS — H1013 Acute atopic conjunctivitis, bilateral: Secondary | ICD-10-CM | POA: Diagnosis not present

## 2019-07-11 DIAGNOSIS — H1010 Acute atopic conjunctivitis, unspecified eye: Secondary | ICD-10-CM | POA: Insufficient documentation

## 2019-07-11 MED ORDER — FLUTICASONE PROPIONATE 50 MCG/ACT NA SUSP
2.0000 | Freq: Every day | NASAL | 5 refills | Status: DC | PRN
Start: 2019-07-11 — End: 2023-06-23

## 2019-07-11 MED ORDER — OLOPATADINE HCL 0.2 % OP SOLN: 1.0000 [drp] | Freq: Every day | OPHTHALMIC | 5 refills | Status: DC | PRN

## 2019-07-11 NOTE — Assessment & Plan Note (Signed)
   Treatment plan as outlined above for allergic rhinitis.  A prescription has been provided for Pataday, one drop per eye daily as needed.  I have also recommended eye lubricant drops (i.e., Natural Tears) as needed. 

## 2019-07-11 NOTE — Assessment & Plan Note (Signed)
   Continue appropriate allergen avoidance measures.  Prednisone has been provided, 20 mg x 4 days, 10 mg x1 day, then stop.  A prescription has been provided for RyVent (carbinoxamine maleate) 6mg  every 6-8 hours as needed.  A prescription has been provided for fluticasone nasal spray, 2 sprays per nostril daily daily as needed. Proper nasal spray technique has been discussed and demonstrated.  Nasal saline spray (i.e., Simply Saline) or nasal saline lavage (i.e., NeilMed) is recommended as needed and prior to medicated nasal sprays.  For thick post nasal drainage, nasal congestion, and/or sinus pressure, add guaifenesin 1200 mg (Mucinex Maximum Strength) plus/minus pseudoephedrine 120 mg  twice daily as needed with adequate hydration as discussed. Pseudoephedrine is only to be used for short-term relief of nasal/sinus congestion. Long-term use is discouraged due to potential side effects.  We were unable to perform skin tests today due to recent administration of antihistamine.   The patient is scheduled to return in the near future for allergy skin testing after having been off of antihistamines for at least 3 days.    To control symptoms while off of antihistamines, prednisone has been provided: 10g per day for 3 days.  Further recommendations will be made at that time based upon skin test results.

## 2019-07-11 NOTE — Patient Instructions (Addendum)
Allergic rhinosinusitis  Continue appropriate allergen avoidance measures.  Prednisone has been provided, 20 mg x 4 days, 10 mg x1 day, then stop.  A prescription has been provided for RyVent (carbinoxamine maleate) 6mg  every 6-8 hours as needed.  A prescription has been provided for fluticasone nasal spray, 2 sprays per nostril daily daily as needed. Proper nasal spray technique has been discussed and demonstrated.  Nasal saline spray (i.e., Simply Saline) or nasal saline lavage (i.e., NeilMed) is recommended as needed and prior to medicated nasal sprays.  For thick post nasal drainage, nasal congestion, and/or sinus pressure, add guaifenesin 1200 mg (Mucinex Maximum Strength) plus/minus pseudoephedrine 120 mg  twice daily as needed with adequate hydration as discussed. Pseudoephedrine is only to be used for short-term relief of nasal/sinus congestion. Long-term use is discouraged due to potential side effects.  We were unable to perform skin tests today due to recent administration of antihistamine.   The patient is scheduled to return in the near future for allergy skin testing after having been off of antihistamines for at least 3 days.    To control symptoms while off of antihistamines, prednisone has been provided: 10g per day for 3 days.  Further recommendations will be made at that time based upon skin test results.  Allergic conjunctivitis  Treatment plan as outlined above for allergic rhinitis.  A prescription has been provided for Pataday, one drop per eye daily as needed.  I have also recommended eye lubricant drops (i.e., Natural Tears) as needed.   Return for allergy skin testing with , FNP or Dr. Thermon Leyland.

## 2019-07-11 NOTE — Progress Notes (Signed)
New Patient Note  RE: Danita Proud MRN: [id number] DOB: 03/23/1981 Date of Office Visit: 07/11/2019  Referring provider: No ref. provider found Primary care provider: Patient, No Pcp Per  Chief Complaint: Allergies   History of present illness: Sierra Foster is a 39 y.o. female presenting today for evaluation of allergic rhinoconjunctivitis.  She complains of nasal congestion, rhinorrhea, sneezing, postnasal drainage, nasal pruritus, ocular pruritus, infraorbital edema, lacrimation, and frontal sinus pressure.  These symptoms occur year-round but are most frequent and severe during the fall.  In an attempt to control the symptoms she has been taking cetirizine 10 mg twice daily over the past year, along with Zaditor eyedrops twice daily.  She reports that the cetirizine had initially worked, however more recently has not seemed to work as well.  She has tried an intranasal steroid in the past but currently is not using a nasal spray.   Assessment and plan: Allergic rhinosinusitis  Continue appropriate allergen avoidance measures.  Prednisone has been provided, 20 mg x 4 days, 10 mg x1 day, then stop.  A prescription has been provided for RyVent (carbinoxamine maleate) 6mg  every 6-8 hours as needed.  A prescription has been provided for fluticasone nasal spray, 2 sprays per nostril daily daily as needed. Proper nasal spray technique has been discussed and demonstrated.  Nasal saline spray (i.e., Simply Saline) or nasal saline lavage (i.e., NeilMed) is recommended as needed and prior to medicated nasal sprays.  For thick post nasal drainage, nasal congestion, and/or sinus pressure, add guaifenesin 1200 mg (Mucinex Maximum Strength) plus/minus pseudoephedrine 120 mg  twice daily as needed with adequate hydration as discussed. Pseudoephedrine is only to be used for short-term relief of nasal/sinus congestion. Long-term use is discouraged due to potential side effects.  We were unable to  perform skin tests today due to recent administration of antihistamine.   The patient is scheduled to return in the near future for allergy skin testing after having been off of antihistamines for at least 3 days.    To control symptoms while off of antihistamines, prednisone has been provided: 10g per day for 3 days.  Further recommendations will be made at that time based upon skin test results.  Allergic conjunctivitis  Treatment plan as outlined above for allergic rhinitis.  A prescription has been provided for Pataday, one drop per eye daily as needed.  I have also recommended eye lubricant drops (i.e., Natural Tears) as needed.   Meds ordered this encounter  Medications  . fluticasone (FLONASE) 50 MCG/ACT nasal spray    Sig: Place 2 sprays into both nostrils daily as needed for allergies or rhinitis.    Dispense:  18.2 mL    Refill:  5  . Olopatadine HCl (PATADAY) 0.2 % SOLN    Sig: Place 1 drop into both eyes daily as needed.    Dispense:  2.5 mL    Refill:  5    Diagnostics: Allergy skin testing: We were unable to perform skin tests today due to recent administration of antihistamine.     Physical examination: Blood pressure 104/78, pulse 79, temperature 97.8 F (36.6 C), temperature source Temporal, resp. rate 20, height 5' 3.5" (1.613 m), weight 158 lb 12.8 oz (72 kg), SpO2 97 %.  General: Alert, interactive, in no acute distress. HEENT: TMs pearly gray, turbinates moderately edematous with clear discharge, post-pharynx erythematous. Neck: Supple without lymphadenopathy. Lungs: Clear to auscultation without wheezing, rhonchi or rales. CV: Normal S1, S2 without murmurs. Abdomen: Nondistended, nontender.  Skin: Warm and dry, without lesions or rashes. Extremities:  No clubbing, cyanosis or edema. Neuro:   Grossly intact.  Review of systems:  Review of systems negative except as noted in HPI / PMHx or noted below: Review of Systems  Constitutional: Negative.     HENT: Negative.   Eyes: Negative.   Respiratory: Negative.   Cardiovascular: Negative.   Gastrointestinal: Negative.   Genitourinary: Negative.   Musculoskeletal: Negative.   Skin: Negative.   Neurological: Negative.   Endo/Heme/Allergies: Negative.   Psychiatric/Behavioral: Negative.     Past medical history:  Past Medical History:  Diagnosis Date  . Chronic ear infection    as an adult    Past surgical history:  Past Surgical History:  Procedure Laterality Date  . APPENDECTOMY  1998    Family history: Family History  Problem Relation Age of Onset  . Allergic rhinitis Father   . Hypertension Father   . Hyperlipidemia Father   . Allergic rhinitis Sister   . Asthma Neg Hx   . Eczema Neg Hx   . Urticaria Neg Hx   . Immunodeficiency Neg Hx   . Atopy Neg Hx   . Angioedema Neg Hx     Social history: Social History   Socioeconomic History  . Marital status: Married    Spouse name: Not on file  . Number of children: 0  . Years of education: Not on file  . Highest education level: Not on file  Occupational History  . Not on file  Tobacco Use  . Smoking status: Never Smoker  . Smokeless tobacco: Never Used  Substance and Sexual Activity  . Alcohol use: Yes    Comment: occasional  . Drug use: No  . Sexual activity: Yes  Other Topics Concern  . Not on file  Social History Narrative  . Not on file   Social Determinants of Health   Financial Resource Strain:   . Difficulty of Paying Living Expenses: Not on file  Food Insecurity:   . Worried About Programme researcher, broadcasting/film/video in the Last Year: Not on file  . Ran Out of Food in the Last Year: Not on file  Transportation Needs:   . Lack of Transportation (Medical): Not on file  . Lack of Transportation (Non-Medical): Not on file  Physical Activity:   . Days of Exercise per Week: Not on file  . Minutes of Exercise per Session: Not on file  Stress:   . Feeling of Stress : Not on file  Social Connections:   .  Frequency of Communication with Friends and Family: Not on file  . Frequency of Social Gatherings with Friends and Family: Not on file  . Attends Religious Services: Not on file  . Active Member of Clubs or Organizations: Not on file  . Attends Banker Meetings: Not on file  . Marital Status: Not on file  Intimate Partner Violence:   . Fear of Current or Ex-Partner: Not on file  . Emotionally Abused: Not on file  . Physically Abused: Not on file  . Sexually Abused: Not on file    Environmental History: The patient lives in a 39 year old house with carpeting in the bedroom, gassy, and central air.  There are 2 dogs in the home which have access to her bedroom.  There is no known mold/water damage in the home.  She is a non-smoker.  Current Outpatient Medications  Medication Sig Dispense Refill  . Ascorbic Acid (VITAMIN C) 1000 MG tablet  Take 1,000 mg by mouth daily.    . cetirizine (ZYRTEC) 10 MG tablet Take 10 mg by mouth daily.    Marland Kitchen ketotifen (ZADITOR) 0.025 % ophthalmic solution 1 drop 2 (two) times daily.    . Prenatal Vit-Fe Fumarate-FA (PRENATAL MULTIVITAMIN) TABS tablet Take 1 tablet by mouth daily at 12 noon.    . Vitamin D, Ergocalciferol, (DRISDOL) 1.25 MG (50000 UNIT) CAPS capsule Take 50,000 Units by mouth every 7 (seven) days.    . fluticasone (FLONASE) 50 MCG/ACT nasal spray Place 2 sprays into both nostrils daily as needed for allergies or rhinitis. 18.2 mL 5  . Olopatadine HCl (PATADAY) 0.2 % SOLN Place 1 drop into both eyes daily as needed. 2.5 mL 5   No current facility-administered medications for this visit.    Known medication allergies: Allergies  Allergen Reactions  . Hydrocodone-Acetaminophen     muscule spasms  . Penicillins Rash    I appreciate the opportunity to take part in Myna's care. Please do not hesitate to contact me with questions.  Sincerely,   R. Edgar Frisk, MD

## 2019-07-12 ENCOUNTER — Other Ambulatory Visit: Payer: Self-pay | Admitting: *Deleted

## 2019-07-12 ENCOUNTER — Telehealth: Payer: Self-pay

## 2019-07-12 MED ORDER — CARBINOXAMINE MALEATE 6 MG PO TABS: 6.0000 mg | ORAL_TABLET | Freq: Four times a day (QID) | ORAL | 5 refills | Status: DC | PRN

## 2019-07-12 NOTE — Telephone Encounter (Signed)
Prescription has been sent in. Called patient and left a detailed voicemail advising per DPR permission.  ?

## 2019-07-12 NOTE — Telephone Encounter (Signed)
Patient called stating she did not receive a prescription for Ryvent. Patient was seen on yesterday.  Please Advise.

## 2019-08-09 DIAGNOSIS — Z01419 Encounter for gynecological examination (general) (routine) without abnormal findings: Secondary | ICD-10-CM | POA: Diagnosis not present

## 2019-08-09 DIAGNOSIS — L709 Acne, unspecified: Secondary | ICD-10-CM | POA: Diagnosis not present

## 2019-08-09 DIAGNOSIS — Z6826 Body mass index (BMI) 26.0-26.9, adult: Secondary | ICD-10-CM | POA: Diagnosis not present

## 2019-11-10 ENCOUNTER — Encounter (HOSPITAL_COMMUNITY): Payer: Self-pay

## 2019-11-10 ENCOUNTER — Ambulatory Visit (HOSPITAL_COMMUNITY): Payer: Self-pay

## 2019-11-10 ENCOUNTER — Ambulatory Visit (INDEPENDENT_AMBULATORY_CARE_PROVIDER_SITE_OTHER): Payer: 59

## 2019-11-10 ENCOUNTER — Other Ambulatory Visit: Payer: Self-pay

## 2019-11-10 ENCOUNTER — Ambulatory Visit (HOSPITAL_COMMUNITY)
Admission: EM | Admit: 2019-11-10 | Discharge: 2019-11-10 | Disposition: A | Discharge status: Home / Self Care | Payer: 59 | Attending: Emergency Medicine | Admitting: Emergency Medicine

## 2019-11-10 DIAGNOSIS — R079 Chest pain, unspecified: Secondary | ICD-10-CM

## 2019-11-10 DIAGNOSIS — M546 Pain in thoracic spine: Secondary | ICD-10-CM | POA: Diagnosis not present

## 2019-11-10 DIAGNOSIS — S299XXA Unspecified injury of thorax, initial encounter: Secondary | ICD-10-CM | POA: Diagnosis not present

## 2019-11-10 MED ORDER — NAPROXEN 500 MG PO TABS
500.0000 mg | ORAL_TABLET | Freq: Two times a day (BID) | ORAL | 0 refills | Status: DC
Start: 2019-11-10 — End: 2020-09-15

## 2019-11-10 NOTE — ED Triage Notes (Signed)
Pt presents with left rib pain after a fall injury a week ago.

## 2019-11-10 NOTE — Discharge Instructions (Signed)
No rib fracture noted May try naprosyn twice daily with food for pain Alternate ice and heat to area for approximately 20 min Monitor for gradual improvement

## 2019-11-11 NOTE — ED Provider Notes (Signed)
[facility]    CSN: [id number] Arrival date & time: 11/10/19  1445      History   Chief Complaint Chief Complaint  Patient presents with  . Appointment  . Rib Injury    HPI Sierra Foster is a 39 y.o. female presenting today for evaluation of rib injury after fall.  Patient reports approximately 1 week ago she slipped on some hardwood stairs and landed on her left lower back/rib cage.  Since she has had continued pain.  The pain was initially was improving, worsened after she returned to work.  Pain is wrapping into left anterior rib cage as well.  Has increased pain with any small movements as well as inspiration.  HPI  Past Medical History:  Diagnosis Date  . Chronic ear infection    as an adult    Patient Active Problem List   Diagnosis Date Noted  . Allergic conjunctivitis 07/11/2019  . Allergic rhinosinusitis 12/25/2015    Past Surgical History:  Procedure Laterality Date  . APPENDECTOMY  1998    OB History   No obstetric history on file.      Home Medications    Prior to Admission medications   Medication Sig Start Date End Date Taking? Authorizing Provider  Ascorbic Acid (VITAMIN C) 1000 MG tablet Take 1,000 mg by mouth daily.    [provider]  Carbinoxamine Maleate (RYVENT) 6 MG TABS Take 6 mg by mouth every 6 (six) hours as needed. 07/12/19   Bobbitt, Sedalia Muta, MD  cetirizine (ZYRTEC) 10 MG tablet Take 10 mg by mouth daily.    [provider]  fluticasone (FLONASE) 50 MCG/ACT nasal spray Place 2 sprays into both nostrils daily as needed for allergies or rhinitis. 07/11/19   Bobbitt, Sedalia Muta, MD  ketotifen (ZADITOR) 0.025 % ophthalmic solution 1 drop 2 (two) times daily.    [provider]  naproxen (NAPROSYN) 500 MG tablet Take 1 tablet (500 mg total) by mouth 2 (two) times daily. 11/10/19   Wieters, Hallie C, PA-C  Olopatadine HCl (PATADAY) 0.2 % SOLN Place 1 drop into both eyes daily as needed. 07/11/19    Bobbitt, Sedalia Muta, MD  Prenatal Vit-Fe Fumarate-FA (PRENATAL MULTIVITAMIN) TABS tablet Take 1 tablet by mouth daily at 12 noon.    [provider]  Vitamin D, Ergocalciferol, (DRISDOL) 1.25 MG (50000 UNIT) CAPS capsule Take 50,000 Units by mouth every 7 (seven) days.    [provider]    Family History Family History  Problem Relation Age of Onset  . Allergic rhinitis Father   . Hypertension Father   . Hyperlipidemia Father   . Allergic rhinitis Sister   . Asthma Neg Hx   . Eczema Neg Hx   . Urticaria Neg Hx   . Immunodeficiency Neg Hx   . Atopy Neg Hx   . Angioedema Neg Hx     Social History Social History   Tobacco Use  . Smoking status: Never Smoker  . Smokeless tobacco: Never Used  Vaping Use  . Vaping Use: Never used  Substance Use Topics  . Alcohol use: Yes    Comment: occasional  . Drug use: No     Allergies   Hydrocodone-acetaminophen and Penicillins   Review of Systems Review of Systems  Constitutional: Negative for fatigue and fever.  HENT: Negative for mouth sores.   Eyes: Negative for visual disturbance.  Respiratory: Negative for shortness of breath.   Cardiovascular: Negative for chest pain.  Gastrointestinal: Negative  for abdominal pain, nausea and vomiting.  Musculoskeletal: Positive for back pain and myalgias. Negative for arthralgias and joint swelling.  Skin: Negative for color change, rash and wound.  Neurological: Negative for dizziness, weakness, light-headedness and headaches.     Physical Exam Triage Vital Signs ED Triage Vitals  Enc Vitals Group     BP 11/10/19 1523 122/69     Pulse Rate 11/10/19 1523 73     Resp 11/10/19 1523 18     Temp 11/10/19 1523 98.1 F (36.7 C)     Temp Source 11/10/19 1523 Oral     SpO2 11/10/19 1523 100 %     Weight --      Height --      Head Circumference --      Peak Flow --      Pain Score 11/10/19 1522 7     Pain Loc --      Pain Edu? --      Excl. in GC? --    No  data found.  Updated Vital Signs BP 122/69 (BP Location: Right Arm)   Pulse 73   Temp 98.1 F (36.7 C) (Oral)   Resp 18   LMP 10/27/2019   SpO2 100%   Visual Acuity Right Eye Distance:   Left Eye Distance:   Bilateral Distance:    Right Eye Near:   Left Eye Near:    Bilateral Near:     Physical Exam Vitals and nursing note reviewed.  Constitutional:      Appearance: She is well-developed.     Comments: No acute distress  HENT:     Head: Normocephalic and atraumatic.     Ears:     Comments: Bilateral ears without tenderness to palpation of external auricle, tragus and mastoid, EAC's without erythema or swelling, TM's with good bony landmarks and cone of light. Non erythematous.     Nose: Nose normal.  Eyes:     Conjunctiva/sclera: Conjunctivae normal.  Cardiovascular:     Rate and Rhythm: Normal rate.  Pulmonary:     Effort: Pulmonary effort is normal. No respiratory distress.     Comments: Breathing comfortably at rest, CTABL, no wheezing, rales or other adventitious sounds auscultated Abdominal:     General: There is no distension.  Musculoskeletal:        General: Normal range of motion.     Cervical back: Neck supple.     Comments: No discoloration or bruising noted to back, tender to palpation of lower thoracic region extending to left flank anterior left lower rib cage  Skin:    General: Skin is warm and dry.  Neurological:     Mental Status: She is alert and oriented to person, place, and time.      UC Treatments / Results  Labs (all labs ordered are listed, but only abnormal results are displayed) Labs Reviewed - No data to display  EKG   Radiology DG Ribs Unilateral W/Chest Left  Result Date: 11/10/2019 CLINICAL DATA:  Fall 1 week ago with left-sided chest pain, initial encounter EXAM: LEFT RIBS AND CHEST - 3+ VIEW COMPARISON:  None. FINDINGS: Cardiac shadows within normal limits. The lungs are well aerated without focal infiltrate or sizable  effusion. No pneumothorax is noted. No rib fractures are noted. IMPRESSION: No acute rib fracture noted. Electronically Signed   By: Alcide Clever M.D.   On: 11/10/2019 16:39    Procedures Procedures (including critical care time)  Medications Ordered in UC Medications -  No data to display  Initial Impression / Assessment and Plan / UC Course  I have reviewed the triage vital signs and the nursing notes.  Pertinent labs & imaging results that were available during my care of the patient were reviewed by me and considered in my medical decision making (see chart for details).     X-ray negative for rib fracture.  Most likely contusion/strain.  Recommending continue anti-inflammatories with continued monitoring for gradual improvement.  Discussed strict return precautions. Patient verbalized understanding and is agreeable with plan.  Final Clinical Impressions(s) / UC Diagnoses   Final diagnoses:  Acute left-sided thoracic back pain  Rib injury     Discharge Instructions     No rib fracture noted May try naprosyn twice daily with food for pain Alternate ice and heat to area for approximately 20 min Monitor for gradual improvement   ED Prescriptions    Medication Sig Dispense Auth. Provider   naproxen (NAPROSYN) 500 MG tablet Take 1 tablet (500 mg total) by mouth 2 (two) times daily. 30 tablet Wieters, Pillager C, PA-C     PDMP not reviewed this encounter.   Sharyon Cable Achille C, PA-C 11/11/19 1023

## 2019-11-29 DIAGNOSIS — H5213 Myopia, bilateral: Secondary | ICD-10-CM | POA: Diagnosis not present

## 2020-01-25 DIAGNOSIS — M7918 Myalgia, other site: Secondary | ICD-10-CM | POA: Diagnosis not present

## 2020-01-25 DIAGNOSIS — M9905 Segmental and somatic dysfunction of pelvic region: Secondary | ICD-10-CM | POA: Diagnosis not present

## 2020-01-25 DIAGNOSIS — M9904 Segmental and somatic dysfunction of sacral region: Secondary | ICD-10-CM | POA: Diagnosis not present

## 2020-01-25 DIAGNOSIS — M9903 Segmental and somatic dysfunction of lumbar region: Secondary | ICD-10-CM | POA: Diagnosis not present

## 2020-01-25 DIAGNOSIS — M545 Low back pain: Secondary | ICD-10-CM | POA: Diagnosis not present

## 2020-02-09 DIAGNOSIS — Z20822 Contact with and (suspected) exposure to covid-19: Secondary | ICD-10-CM | POA: Diagnosis not present

## 2020-02-09 DIAGNOSIS — Z03818 Encounter for observation for suspected exposure to other biological agents ruled out: Secondary | ICD-10-CM | POA: Diagnosis not present

## 2020-02-22 MED FILL — FLUARIX QUADRIVALENT 0.5 ML: 0.5 | 1 days supply | Qty: 1 | Fill #0

## 2020-05-07 DIAGNOSIS — M9904 Segmental and somatic dysfunction of sacral region: Secondary | ICD-10-CM | POA: Diagnosis not present

## 2020-05-07 DIAGNOSIS — M9905 Segmental and somatic dysfunction of pelvic region: Secondary | ICD-10-CM | POA: Diagnosis not present

## 2020-05-07 DIAGNOSIS — M545 Low back pain, unspecified: Secondary | ICD-10-CM | POA: Diagnosis not present

## 2020-05-07 DIAGNOSIS — M9903 Segmental and somatic dysfunction of lumbar region: Secondary | ICD-10-CM | POA: Diagnosis not present

## 2020-05-07 DIAGNOSIS — M7918 Myalgia, other site: Secondary | ICD-10-CM | POA: Diagnosis not present

## 2020-05-08 DIAGNOSIS — R030 Elevated blood-pressure reading, without diagnosis of hypertension: Secondary | ICD-10-CM | POA: Diagnosis not present

## 2020-05-08 DIAGNOSIS — Z6826 Body mass index (BMI) 26.0-26.9, adult: Secondary | ICD-10-CM | POA: Insufficient documentation

## 2020-05-08 DIAGNOSIS — M5440 Lumbago with sciatica, unspecified side: Secondary | ICD-10-CM | POA: Insufficient documentation

## 2020-05-15 DIAGNOSIS — M9905 Segmental and somatic dysfunction of pelvic region: Secondary | ICD-10-CM | POA: Diagnosis not present

## 2020-05-15 DIAGNOSIS — M7918 Myalgia, other site: Secondary | ICD-10-CM | POA: Diagnosis not present

## 2020-05-15 DIAGNOSIS — M9903 Segmental and somatic dysfunction of lumbar region: Secondary | ICD-10-CM | POA: Diagnosis not present

## 2020-05-15 DIAGNOSIS — M545 Low back pain, unspecified: Secondary | ICD-10-CM | POA: Diagnosis not present

## 2020-05-15 DIAGNOSIS — M9904 Segmental and somatic dysfunction of sacral region: Secondary | ICD-10-CM | POA: Diagnosis not present

## 2020-05-21 DIAGNOSIS — M9904 Segmental and somatic dysfunction of sacral region: Secondary | ICD-10-CM | POA: Diagnosis not present

## 2020-05-21 DIAGNOSIS — M9903 Segmental and somatic dysfunction of lumbar region: Secondary | ICD-10-CM | POA: Diagnosis not present

## 2020-05-21 DIAGNOSIS — M7918 Myalgia, other site: Secondary | ICD-10-CM | POA: Diagnosis not present

## 2020-05-21 DIAGNOSIS — M545 Low back pain, unspecified: Secondary | ICD-10-CM | POA: Diagnosis not present

## 2020-05-21 DIAGNOSIS — M9905 Segmental and somatic dysfunction of pelvic region: Secondary | ICD-10-CM | POA: Diagnosis not present

## 2020-05-25 ENCOUNTER — Encounter (HOSPITAL_COMMUNITY): Payer: Self-pay

## 2020-05-25 ENCOUNTER — Emergency Department (HOSPITAL_COMMUNITY): Payer: 59

## 2020-05-25 ENCOUNTER — Emergency Department (HOSPITAL_COMMUNITY)
Admission: EM | Admit: 2020-05-25 | Discharge: 2020-05-25 | Disposition: A | Discharge status: Home / Self Care | Payer: 59 | Attending: Emergency Medicine | Admitting: Emergency Medicine

## 2020-05-25 ENCOUNTER — Other Ambulatory Visit: Payer: Self-pay

## 2020-05-25 DIAGNOSIS — R509 Fever, unspecified: Secondary | ICD-10-CM | POA: Diagnosis not present

## 2020-05-25 DIAGNOSIS — Z9049 Acquired absence of other specified parts of digestive tract: Secondary | ICD-10-CM | POA: Insufficient documentation

## 2020-05-25 DIAGNOSIS — Z20822 Contact with and (suspected) exposure to covid-19: Secondary | ICD-10-CM | POA: Insufficient documentation

## 2020-05-25 DIAGNOSIS — N3000 Acute cystitis without hematuria: Secondary | ICD-10-CM

## 2020-05-25 DIAGNOSIS — R079 Chest pain, unspecified: Secondary | ICD-10-CM | POA: Diagnosis not present

## 2020-05-25 DIAGNOSIS — I878 Other specified disorders of veins: Secondary | ICD-10-CM | POA: Diagnosis not present

## 2020-05-25 DIAGNOSIS — K3189 Other diseases of stomach and duodenum: Secondary | ICD-10-CM | POA: Diagnosis not present

## 2020-05-25 DIAGNOSIS — R109 Unspecified abdominal pain: Secondary | ICD-10-CM | POA: Diagnosis present

## 2020-05-25 DIAGNOSIS — N1330 Unspecified hydronephrosis: Secondary | ICD-10-CM | POA: Diagnosis not present

## 2020-05-25 LAB — I-STAT BETA HCG BLOOD, ED (MC, WL, AP ONLY): I-stat hCG, quantitative: <5 m[IU]/mL (ref <5)

## 2020-05-25 LAB — RESP PANEL BY RT-PCR (FLU A&B, COVID) ARPGX2
Influenza A by PCR: NEGATIVE
Influenza B by PCR: NEGATIVE
SARS Coronavirus 2 by RT PCR: NEGATIVE

## 2020-05-25 LAB — CBC WITH DIFFERENTIAL/PLATELET
Abs Immature Granulocytes: 0.05 10*3/uL (ref 0.00–0.07)
Basophils Absolute: 0 10*3/uL (ref 0.0–0.1)
Basophils Relative: 0 %
Eosinophils Absolute: 0 10*3/uL (ref 0.0–0.5)
Eosinophils Relative: 0 %
HCT: 35.3 % — ABNORMAL LOW (ref 36.0–46.0)
Hemoglobin: 12.1 g/dL (ref 12.0–15.0)
Immature Granulocytes: 0 %
Lymphocytes Relative: 3 %
Lymphs Abs: 0.4 10*3/uL — ABNORMAL LOW (ref 0.7–4.0)
MCH: 31 pg (ref 26.0–34.0)
MCHC: 34.3 g/dL (ref 30.0–36.0)
MCV: 90.5 fL (ref 80.0–100.0)
Monocytes Absolute: 0.8 10*3/uL (ref 0.1–1.0)
Monocytes Relative: 6 %
Neutro Abs: 11.9 10*3/uL — ABNORMAL HIGH (ref 1.7–7.7)
Neutrophils Relative %: 91 %
Platelets: 220 10*3/uL (ref 150–400)
RBC: 3.9 MIL/uL (ref 3.87–5.11)
RDW: 13 % (ref 11.5–15.5)
WBC: 13.2 10*3/uL — ABNORMAL HIGH (ref 4.0–10.5)
nRBC: 0 % (ref 0.0–0.2)

## 2020-05-25 LAB — COMPREHENSIVE METABOLIC PANEL
ALT: 12 U/L (ref 0–44)
AST: 13 U/L — ABNORMAL LOW (ref 15–41)
Albumin: 4 g/dL (ref 3.5–5.0)
Alkaline Phosphatase: 56 U/L (ref 38–126)
Anion gap: 10 (ref 5–15)
BUN: 18 mg/dL (ref 6–20)
CO2: 21 mmol/L — ABNORMAL LOW (ref 22–32)
Calcium: 8.7 mg/dL — ABNORMAL LOW (ref 8.9–10.3)
Chloride: 107 mmol/L (ref 98–111)
Creatinine, Ser: 0.84 mg/dL (ref 0.44–1.00)
GFR, Estimated: >60 mL/min (ref >60)
Glucose, Bld: 111 mg/dL — ABNORMAL HIGH (ref 70–99)
Potassium: 3.8 mmol/L (ref 3.5–5.1)
Sodium: 138 mmol/L (ref 135–145)
Total Bilirubin: 0.6 mg/dL (ref 0.3–1.2)
Total Protein: 6.6 g/dL (ref 6.5–8.1)

## 2020-05-25 LAB — URINALYSIS, ROUTINE W REFLEX MICROSCOPIC
Bilirubin Urine: NEGATIVE
Glucose, UA: NEGATIVE mg/dL
Ketones, ur: NEGATIVE mg/dL
Nitrite: POSITIVE — AB
Protein, ur: NEGATIVE mg/dL
Specific Gravity, Urine: 1.011 (ref 1.005–1.030)
WBC, UA: >50 WBC/hpf — ABNORMAL HIGH (ref 0–5)
pH: 5 (ref 5.0–8.0)

## 2020-05-25 LAB — LIPASE, BLOOD: Lipase: 30 U/L (ref 11–51)

## 2020-05-25 MED ORDER — CEPHALEXIN 500 MG PO CAPS
500.0000 mg | ORAL_CAPSULE | Freq: Two times a day (BID) | ORAL | 0 refills | Status: AC
Start: 2020-05-25 — End: 2020-06-01

## 2020-05-25 MED ORDER — SODIUM CHLORIDE 0.9 % IV BOLUS
1000.0000 mL | Freq: Once | INTRAVENOUS | Status: AC
  Administered 2020-05-25: 1000 mL via INTRAVENOUS

## 2020-05-25 MED ORDER — CEPHALEXIN 500 MG PO CAPS
500.0000 mg | ORAL_CAPSULE | Freq: Once | ORAL | Status: AC
  Administered 2020-05-25: 500 mg via ORAL
  Filled 2020-05-25: qty 1

## 2020-05-25 MED ORDER — ACETAMINOPHEN 325 MG PO TABS
650.0000 mg | ORAL_TABLET | Freq: Once | ORAL | Status: AC | PRN
  Administered 2020-05-25: 650 mg via ORAL
  Filled 2020-05-25: qty 2

## 2020-05-25 NOTE — ED Triage Notes (Signed)
Patient arrived stating on Friday she began having intermittent mid right sided back pain. Three days ago she reports starting to have chills, reports fever at home of 102 and took 800 mg Ibuprofen, last dose this morning at 0530

## 2020-05-25 NOTE — ED Provider Notes (Signed)
[facility]-EMERGENCY DEPT Provider Note   CSN: [id number] Arrival date & time: 05/25/20  9767     History Chief Complaint  Patient presents with  . Fever    Sierra Foster is a 39 y.o. female.  The history is provided by the patient.  Abdominal Pain Pain location:  R flank Pain quality: aching   Pain radiates to:  Does not radiate Pain severity:  Mild Onset quality:  Gradual Timing:  Intermittent Progression:  Waxing and waning Chronicity:  New Context comment:  Fever and flank pain, maybe pain with urination. No hx of stones. Has had appendix out before Relieved by:  Nothing Ineffective treatments:  None tried Associated symptoms: dysuria   Associated symptoms: no chest pain, no chills, no cough, no fever, no hematuria, no nausea, no shortness of breath, no sore throat, no vaginal bleeding and no vomiting   Risk factors: multiple surgeries        Past Medical History:  Diagnosis Date  . Chronic ear infection    as an adult    Patient Active Problem List   Diagnosis Date Noted  . Allergic conjunctivitis 07/11/2019  . Allergic rhinosinusitis 12/25/2015    Past Surgical History:  Procedure Laterality Date  . APPENDECTOMY  1998     OB History   No obstetric history on file.     Family History  Problem Relation Age of Onset  . Allergic rhinitis Father   . Hypertension Father   . Hyperlipidemia Father   . Allergic rhinitis Sister   . Asthma Neg Hx   . Eczema Neg Hx   . Urticaria Neg Hx   . Immunodeficiency Neg Hx   . Atopy Neg Hx   . Angioedema Neg Hx     Social History   Tobacco Use  . Smoking status: Never Smoker  . Smokeless tobacco: Never Used  Vaping Use  . Vaping Use: Never used  Substance Use Topics  . Alcohol use: Yes    Comment: occasional  . Drug use: No    Home Medications Prior to Admission medications   Medication Sig Start Date End Date Taking? Authorizing Provider  Carbinoxamine Maleate (RYVENT) 6 MG  TABS Take 6 mg by mouth every 6 (six) hours as needed. Patient taking differently: Take 6 mg by mouth at bedtime. 07/12/19  Yes Bobbitt, Heywood Iles, MD  fluticasone [facility]) 50 MCG/ACT nasal spray Place 2 sprays into both nostrils daily as needed for allergies or rhinitis. 07/11/19  Yes Bobbitt, Heywood Iles, MD  ibuprofen (ADVIL) 200 MG tablet Take 800 mg by mouth every 6 (six) hours as needed for mild pain or fever.   Yes [provider]  ketotifen (ZADITOR) 0.025 % ophthalmic solution Place 1 drop into both eyes at bedtime.   Yes [provider]  levonorgestrel (KYLEENA) 19.5 MG IUD 1 Device by Intrauterine route once. Every 5 years   Yes [provider]  meloxicam (MOBIC) 7.5 MG tablet Take 7.5 mg by mouth daily as needed for pain. 05/08/20  Yes [provider]  methocarbamol (ROBAXIN) 750 MG tablet Take 750 mg by mouth every 4 (four) hours as needed for muscle spasms. 05/08/20  Yes [provider]  spironolactone (ALDACTONE) 50 MG tablet Take 50 mg by mouth daily. 04/09/20  Yes [provider]  cephALEXin (KEFLEX) 500 MG capsule Take 1 capsule (500 mg total) by mouth 2 (two) times daily for 7 days. 05/25/20 06/01/20  Durwood Dittus, DO  naproxen (NAPROSYN) 500  MG tablet Take 1 tablet (500 mg total) by mouth 2 (two) times daily. Patient not taking: Reported on 05/25/2020 11/10/19   Wieters, Hallie C, PA-C  Olopatadine HCl (PATADAY) 0.2 % SOLN Place 1 drop into both eyes daily as needed. Patient not taking: Reported on 05/25/2020 07/11/19   Bobbitt, Heywood Iles, MD    Allergies    Hydrocodone-acetaminophen and Penicillins  Review of Systems   Review of Systems  Constitutional: Negative for chills and fever.  HENT: Negative for ear pain and sore throat.   Eyes: Negative for pain and visual disturbance.  Respiratory: Negative for cough and shortness of breath.   Cardiovascular: Negative for chest pain and palpitations.  Gastrointestinal:  Positive for abdominal pain. Negative for nausea and vomiting.  Genitourinary: Positive for dysuria and flank pain. Negative for decreased urine volume, difficulty urinating, frequency, hematuria, pelvic pain, urgency and vaginal bleeding.  Musculoskeletal: Negative for arthralgias and back pain.  Skin: Negative for color change and rash.  Neurological: Negative for seizures and syncope.  All other systems reviewed and are negative.   Physical Exam Updated Vital Signs  ED Triage Vitals  Enc Vitals Group     BP 05/25/20 0639 (!) 143/78     Pulse Rate 05/25/20 0639 (!) 109     Resp 05/25/20 0639 20     Temp 05/25/20 0639 (!) 102.5 F (39.2 C)     Temp Source 05/25/20 0639 Oral     SpO2 05/25/20 0639 99 %     Weight 05/25/20 0639 158 lb (71.7 kg)     Height 05/25/20 0639 5\' 3"  (1.6 m)     Head Circumference --      Peak Flow --      Pain Score 05/25/20 0643 3     Pain Loc --      Pain Edu? --      Excl. in GC? --     Physical Exam Vitals and nursing note reviewed.  Constitutional:      General: She is not in acute distress.    Appearance: She is well-developed and well-nourished. She is not ill-appearing.  HENT:     Head: Normocephalic and atraumatic.     Nose: Nose normal.     Mouth/Throat:     Mouth: Mucous membranes are moist.     Pharynx: Posterior oropharyngeal erythema present. No oropharyngeal exudate.  Eyes:     Extraocular Movements: Extraocular movements intact.     Conjunctiva/sclera: Conjunctivae normal.     Pupils: Pupils are equal, round, and reactive to light.  Cardiovascular:     Rate and Rhythm: Normal rate and regular rhythm.     Pulses: Normal pulses.     Heart sounds: Normal heart sounds. No murmur heard.   Pulmonary:     Effort: Pulmonary effort is normal. No respiratory distress.     Breath sounds: Normal breath sounds.  Abdominal:     General: Abdomen is flat.     Palpations: Abdomen is soft.     Tenderness: There is no abdominal  tenderness. There is right CVA tenderness.  Musculoskeletal:        General: No edema.     Cervical back: Normal range of motion and neck supple.  Skin:    General: Skin is warm and dry.     Capillary Refill: Capillary refill takes less than 2 seconds.  Neurological:     General: No focal deficit present.     Mental Status: She is alert.  Psychiatric:        Mood and Affect: Mood and affect normal.     ED Results / Procedures / Treatments   Labs (all labs ordered are listed, but only abnormal results are displayed) Labs Reviewed  CBC WITH DIFFERENTIAL/PLATELET - Abnormal; Notable for the following components:      Result Value   WBC 13.2 (*)    HCT 35.3 (*)    Neutro Abs 11.9 (*)    Lymphs Abs 0.4 (*)    All other components within normal limits  COMPREHENSIVE METABOLIC PANEL - Abnormal; Notable for the following components:   CO2 21 (*)    Glucose, Bld 111 (*)    Calcium 8.7 (*)    AST 13 (*)    All other components within normal limits  URINALYSIS, ROUTINE W REFLEX MICROSCOPIC - Abnormal; Notable for the following components:   APPearance HAZY (*)    Hgb urine dipstick SMALL (*)    Nitrite POSITIVE (*)    Leukocytes,Ua LARGE (*)    WBC, UA >50 (*)    Bacteria, UA MANY (*)    Non Squamous Epithelial 0-5 (*)    All other components within normal limits  RESP PANEL BY RT-PCR (FLU A&B, COVID) ARPGX2  URINE CULTURE  LIPASE, BLOOD  I-STAT BETA HCG BLOOD, ED ([facility], [facility], [facility] ONLY)    EKG None  Radiology DG Chest Portable 1 View  Result Date: 05/25/2020 CLINICAL DATA:  Fevers and right-sided chest pain EXAM: PORTABLE CHEST 1 VIEW COMPARISON:  11/10/2019 FINDINGS: The heart size and mediastinal contours are within normal limits. Both lungs are clear. The visualized skeletal structures are unremarkable. IMPRESSION: No active disease. Electronically Signed   By: Alcide Clever M.D.   On: 05/25/2020 08:13   CT Renal Stone Study  Result Date: 05/25/2020 CLINICAL DATA:  Right  flank pain, chills, fever EXAM: CT ABDOMEN AND PELVIS WITHOUT CONTRAST TECHNIQUE: Multidetector CT imaging of the abdomen and pelvis was performed following the standard protocol without IV contrast. COMPARISON:  CT 06/24/2010 FINDINGS: Lower chest: No acute abnormality. Hepatobiliary: Gallbladder unremarkable. Probable hepatic cysts. No biliary ductal dilatation. Pancreas: Unremarkable. No pancreatic ductal dilatation or surrounding inflammatory changes. Spleen: Normal in size without focal abnormality. Adrenals/Urinary Tract: Adrenal glands unremarkable. Renal contours symmetric. There is mild right pelvicaliectasis and proximal ureterectasis seen to the level of the SI joint, decompressed distally, with no urolithiasis identified. Urinary bladder partially distended, unremarkable. Stomach/Bowel: Stomach is decompressed. Small bowel is nondilated. Clips from previous appendectomy. Colon is nondilated, unremarkable. Vascular/Lymphatic: No significant vascular findings are present. No enlarged abdominal or pelvic lymph nodes. Reproductive: IUD in place.  No adnexal mass. Other: Left pelvic phleboliths.  No ascites.  No free air. Musculoskeletal: No acute or significant osseous findings. IMPRESSION: Mild right pelvicaliectasis and proximal ureterectasis to the level of the SI joint, with no urolithiasis identified. Correlate with any clinical or laboratory evidence of recent stone passage or UTI. Electronically Signed   By: Corlis Leak M.D.   On: 05/25/2020 08:53    Procedures Procedures (including critical care time)  Medications Ordered in ED Medications  cephALEXin (KEFLEX) capsule 500 mg (has no administration in time range)  acetaminophen (TYLENOL) tablet 650 mg (650 mg Oral Given 05/25/20 0649)  sodium chloride 0.9 % bolus 1,000 mL (0 mLs Intravenous Stopped 05/25/20 0916)    ED Course  I have reviewed the triage vital signs and the nursing notes.  Pertinent labs & imaging results that were  available during  my care of the patient were reviewed by me and considered in my medical decision making (see chart for details).    MDM Rules/Calculators/A&P                          Joesph JulyKristin Wilhite is a 39 year old female with no significant medical history presents the ED with right flank pain and fever.  No history of kidney stones.  Does have some may be pain with urination.  Has had her appendix removed in the past.  Does not have any focal tenderness in the right upper quadrant and feels less likely to be a cholecystitis.  We will get a CT scan to evaluate for kidney stone.  Suspect may be pyelonephritis/UTI.  Denies any cough or sputum production we will get a chest x-ray to look for right lower lobe pneumonia.  Will give IV fluids, Tylenol and basic labs and reevaluate.  Patient with white count of 13 but otherwise labs are unremarkable.  Urinalysis is consistent with infection.  Covid test negative.  No pneumonia on chest x-ray.  CT scan showed no acute findings.  No kidney stone.  May be some changes suggestive of a recently passed kidney stone.  Overall vital signs have improved following IV fluids and Tylenol.  Patient given Keflex for UTI and suspect possibly passed kidney stone.  No nausea, no vomiting overall very well-appearing.  Understands return precautions and discharged in ED in good condition.  This chart was dictated using voice recognition software.  Despite best efforts to proofread,  errors can occur which can change the documentation meaning.    Final Clinical Impression(s) / ED Diagnoses Final diagnoses:  Acute cystitis without hematuria    Rx / DC Orders ED Discharge Orders         Ordered    cephALEXin (KEFLEX) 500 MG capsule  2 times daily        05/25/20 1023           [place], Lachele Lievanos, DO 05/25/20 1025

## 2020-05-25 NOTE — ED Notes (Signed)
Called lab, spoke with Cloretta Ned, requested urine culture be pulled from urine sample sent to lab. lab agreed.

## 2020-05-27 LAB — URINE CULTURE: Culture: >=100000 — AB

## 2020-05-28 ENCOUNTER — Telehealth: Payer: Self-pay | Admitting: Emergency Medicine

## 2020-05-28 DIAGNOSIS — M5440 Lumbago with sciatica, unspecified side: Secondary | ICD-10-CM | POA: Diagnosis not present

## 2020-05-28 DIAGNOSIS — M545 Low back pain, unspecified: Secondary | ICD-10-CM | POA: Diagnosis not present

## 2020-05-28 NOTE — Telephone Encounter (Signed)
Post ED Visit - Positive Culture Follow-up  Culture report reviewed by antimicrobial stewardship pharmacist: [facility] []  , Pharm.D. []  Enzo Bi, Pharm.D., BCPS AQ-ID []  , Pharm.D., BCPS []  Celedonio Miyamoto, Pharm.D., BCPS []  Dixon, Garvin Fila.D., BCPS, AAHIVP []  , Pharm.D., BCPS, AAHIVP []  Georgina Pillion, PharmD, BCPS []  , PharmD, BCPS []  [place], PharmD, BCPS []  [address], PharmD []  , PharmD, BCPS []  Estella Husk, PharmD  Pharmacy Team []  Lysle Pearl, PharmD []  , PharmD []  Phillips Climes, PharmD []  , Rph []  Agapito Games) , PharmD []  Verlan Friends, PharmD []  , PharmD []  Mervyn Gay, PharmD []  , PharmD []  Vinnie Level, PharmD []  [facility], PharmD []  , PharmD []  Len Childs, PharmD   Positive urine culture Treated with cephalexin, organism sensitive to the same and no further patient follow-up is required at this time.  05/28/2020, 11:22 AM

## 2020-06-05 DIAGNOSIS — M545 Low back pain, unspecified: Secondary | ICD-10-CM | POA: Diagnosis not present

## 2020-06-05 DIAGNOSIS — M7918 Myalgia, other site: Secondary | ICD-10-CM | POA: Diagnosis not present

## 2020-06-05 DIAGNOSIS — M9905 Segmental and somatic dysfunction of pelvic region: Secondary | ICD-10-CM | POA: Diagnosis not present

## 2020-06-05 DIAGNOSIS — M9904 Segmental and somatic dysfunction of sacral region: Secondary | ICD-10-CM | POA: Diagnosis not present

## 2020-06-05 DIAGNOSIS — M9903 Segmental and somatic dysfunction of lumbar region: Secondary | ICD-10-CM | POA: Diagnosis not present

## 2020-06-05 DIAGNOSIS — N200 Calculus of kidney: Secondary | ICD-10-CM | POA: Diagnosis not present

## 2020-06-05 DIAGNOSIS — R300 Dysuria: Secondary | ICD-10-CM | POA: Diagnosis not present

## 2020-06-05 DIAGNOSIS — R350 Frequency of micturition: Secondary | ICD-10-CM | POA: Diagnosis not present

## 2020-06-12 DIAGNOSIS — M9905 Segmental and somatic dysfunction of pelvic region: Secondary | ICD-10-CM | POA: Diagnosis not present

## 2020-06-12 DIAGNOSIS — M545 Low back pain, unspecified: Secondary | ICD-10-CM | POA: Diagnosis not present

## 2020-06-12 DIAGNOSIS — M9904 Segmental and somatic dysfunction of sacral region: Secondary | ICD-10-CM | POA: Diagnosis not present

## 2020-06-12 DIAGNOSIS — M9903 Segmental and somatic dysfunction of lumbar region: Secondary | ICD-10-CM | POA: Diagnosis not present

## 2020-06-12 DIAGNOSIS — M7918 Myalgia, other site: Secondary | ICD-10-CM | POA: Diagnosis not present

## 2020-06-19 DIAGNOSIS — M5440 Lumbago with sciatica, unspecified side: Secondary | ICD-10-CM | POA: Diagnosis not present

## 2020-06-19 DIAGNOSIS — M7918 Myalgia, other site: Secondary | ICD-10-CM | POA: Diagnosis not present

## 2020-06-19 DIAGNOSIS — Z6826 Body mass index (BMI) 26.0-26.9, adult: Secondary | ICD-10-CM | POA: Diagnosis not present

## 2020-06-19 DIAGNOSIS — M9905 Segmental and somatic dysfunction of pelvic region: Secondary | ICD-10-CM | POA: Diagnosis not present

## 2020-06-19 DIAGNOSIS — M9904 Segmental and somatic dysfunction of sacral region: Secondary | ICD-10-CM | POA: Diagnosis not present

## 2020-06-19 DIAGNOSIS — M9903 Segmental and somatic dysfunction of lumbar region: Secondary | ICD-10-CM | POA: Diagnosis not present

## 2020-06-19 DIAGNOSIS — M545 Low back pain, unspecified: Secondary | ICD-10-CM | POA: Diagnosis not present

## 2020-07-03 DIAGNOSIS — M9905 Segmental and somatic dysfunction of pelvic region: Secondary | ICD-10-CM | POA: Diagnosis not present

## 2020-07-03 DIAGNOSIS — M7918 Myalgia, other site: Secondary | ICD-10-CM | POA: Diagnosis not present

## 2020-07-03 DIAGNOSIS — M545 Low back pain, unspecified: Secondary | ICD-10-CM | POA: Diagnosis not present

## 2020-07-03 DIAGNOSIS — M9904 Segmental and somatic dysfunction of sacral region: Secondary | ICD-10-CM | POA: Diagnosis not present

## 2020-07-03 DIAGNOSIS — M9903 Segmental and somatic dysfunction of lumbar region: Secondary | ICD-10-CM | POA: Diagnosis not present

## 2020-07-17 DIAGNOSIS — M9905 Segmental and somatic dysfunction of pelvic region: Secondary | ICD-10-CM | POA: Diagnosis not present

## 2020-07-17 DIAGNOSIS — M9904 Segmental and somatic dysfunction of sacral region: Secondary | ICD-10-CM | POA: Diagnosis not present

## 2020-07-17 DIAGNOSIS — M545 Low back pain, unspecified: Secondary | ICD-10-CM | POA: Diagnosis not present

## 2020-07-17 DIAGNOSIS — M9903 Segmental and somatic dysfunction of lumbar region: Secondary | ICD-10-CM | POA: Diagnosis not present

## 2020-07-17 DIAGNOSIS — M7918 Myalgia, other site: Secondary | ICD-10-CM | POA: Diagnosis not present

## 2020-07-31 DIAGNOSIS — M545 Low back pain, unspecified: Secondary | ICD-10-CM | POA: Diagnosis not present

## 2020-07-31 DIAGNOSIS — M9903 Segmental and somatic dysfunction of lumbar region: Secondary | ICD-10-CM | POA: Diagnosis not present

## 2020-07-31 DIAGNOSIS — M7918 Myalgia, other site: Secondary | ICD-10-CM | POA: Diagnosis not present

## 2020-07-31 DIAGNOSIS — M9904 Segmental and somatic dysfunction of sacral region: Secondary | ICD-10-CM | POA: Diagnosis not present

## 2020-07-31 DIAGNOSIS — M9905 Segmental and somatic dysfunction of pelvic region: Secondary | ICD-10-CM | POA: Diagnosis not present

## 2020-08-13 DIAGNOSIS — M9905 Segmental and somatic dysfunction of pelvic region: Secondary | ICD-10-CM | POA: Diagnosis not present

## 2020-08-13 DIAGNOSIS — M7918 Myalgia, other site: Secondary | ICD-10-CM | POA: Diagnosis not present

## 2020-08-13 DIAGNOSIS — M9904 Segmental and somatic dysfunction of sacral region: Secondary | ICD-10-CM | POA: Diagnosis not present

## 2020-08-13 DIAGNOSIS — M545 Low back pain, unspecified: Secondary | ICD-10-CM | POA: Diagnosis not present

## 2020-08-13 DIAGNOSIS — M9903 Segmental and somatic dysfunction of lumbar region: Secondary | ICD-10-CM | POA: Diagnosis not present

## 2020-09-07 DIAGNOSIS — Z20822 Contact with and (suspected) exposure to covid-19: Secondary | ICD-10-CM | POA: Diagnosis not present

## 2020-09-07 DIAGNOSIS — Z03818 Encounter for observation for suspected exposure to other biological agents ruled out: Secondary | ICD-10-CM | POA: Diagnosis not present

## 2020-09-07 DIAGNOSIS — R509 Fever, unspecified: Secondary | ICD-10-CM | POA: Diagnosis not present

## 2020-09-07 DIAGNOSIS — J029 Acute pharyngitis, unspecified: Secondary | ICD-10-CM | POA: Diagnosis not present

## 2020-09-07 DIAGNOSIS — U071 COVID-19: Secondary | ICD-10-CM | POA: Diagnosis not present

## 2020-09-09 ENCOUNTER — Telehealth: Payer: 59 | Admitting: Physician Assistant

## 2020-09-09 ENCOUNTER — Encounter: Payer: Self-pay | Admitting: Physician Assistant

## 2020-09-09 DIAGNOSIS — U071 COVID-19: Secondary | ICD-10-CM

## 2020-09-09 MED ORDER — PREDNISONE 10 MG (21) PO TBPK: ORAL_TABLET | ORAL | 0 refills | Status: DC

## 2020-09-09 NOTE — Patient Instructions (Signed)
10 Things You Can Do to Manage Your COVID-19 Symptoms at Home If you have possible or confirmed COVID-19: 1. Stay home except to get medical care. 2. Monitor your symptoms carefully. If your symptoms get worse, call your healthcare provider immediately. 3. Get rest and stay hydrated. 4. If you have a medical appointment, call the healthcare provider ahead of time and tell them that you have or may have COVID-19. 5. For medical emergencies, call 911 and notify the dispatch personnel that you have or may have COVID-19. 6. Cover your cough and sneezes with a tissue or use the inside of your elbow. 7. Wash your hands often with soap and water for at least 20 seconds or clean your hands with an alcohol-based hand sanitizer that contains at least 60% alcohol. 8. As much as possible, stay in a specific room and away from other people in your home. Also, you should use a separate bathroom, if available. If you need to be around other people in or outside of the home, wear a mask. 9. Avoid sharing personal items with other people in your household, like dishes, towels, and bedding. 10. Clean all surfaces that are touched often, like counters, tabletops, and doorknobs. Use household cleaning sprays or wipes according to the label instructions. [email or web address] 12/14/2019 This information is not intended to replace advice given to you by your health care provider. Make sure you discuss any questions you have with your health care provider.  COVID-19: What to Do if You Are Sick If you have a fever, cough or other symptoms, you might have COVID-19. Most people have mild illness and are able to recover at home. If you are sick:  Keep track of your symptoms.  If you have an emergency warning sign (including trouble breathing), call 911. Steps to help prevent the spread of COVID-19 if you are sick If you are sick with COVID-19 or think you might have COVID-19, follow the steps below to care for yourself  and to help protect other people in your home and community. Stay home except to get medical care  Stay home. Most people with COVID-19 have mild illness and can recover at home without medical care. Do not leave your home, except to get medical care. Do not visit public areas.  Take care of yourself. Get rest and stay hydrated. Take over-the-counter medicines, such as acetaminophen, to help you feel better.  Stay in touch with your doctor. Call before you get medical care. Be sure to get care if you have trouble breathing, or have any other emergency warning signs, or if you think it is an emergency.  Avoid public transportation, ride-sharing, or taxis. Separate yourself from other people As much as possible, stay in a specific room and away from other people and pets in your home. If possible, you should use a separate bathroom. If you need to be around other people or animals in or outside of the home, wear a mask. Tell your close contactsthat they may have been exposed to COVID-19. An infected person can spread COVID-19 starting 48 hours (or 2 days) before the person has any symptoms or tests positive. By letting your close contacts know they may have been exposed to COVID-19, you are helping to protect everyone.  Additional guidance is available for those living in close quarters and shared housing.  See COVID-19 and Animals if you have questions about pets.  If you are diagnosed with COVID-19, someone from the health department may call you. Answer  the call to slow the spread. Monitor your symptoms  Symptoms of COVID-19 include fever, cough, or other symptoms.  Follow care instructions from your healthcare provider and local health department. Your local health authorities may give instructions on checking your symptoms and reporting information. When to seek emergency medical attention Look for emergency warning signs* for COVID-19. If someone is showing any of these signs, seek  emergency medical care immediately:  Trouble breathing  Persistent pain or pressure in the chest  New confusion  Inability to wake or stay awake  Pale, gray, or blue-colored skin, lips, or nail beds, depending on skin tone *This list is not all possible symptoms. Please call your medical provider for any other symptoms that are severe or concerning to you. Call 911 or call ahead to your local emergency facility: Notify the operator that you are seeking care for someone who has or may have COVID-19. Call ahead before visiting your doctor  Call ahead. Many medical visits for routine care are being postponed or done by phone or telemedicine.  If you have a medical appointment that cannot be postponed, call your doctor's office, and tell them you have or may have COVID-19. This will help the office protect themselves and other patients. Get  tested  If you have symptoms of COVID-19, get tested. While waiting for test results, you stay away from others, including staying apart from those living in your household.  You can visit your state, tribal, local, and territorialhealth department's website to look for the latest local information on testing sites. If you are sick, wear a mask over your nose and mouth  You should wear a mask over your nose and mouth if you must be around other people or animals, including pets (even at home).  You don't need to wear the mask if you are alone. If you can't put on a mask (because of trouble breathing, for example), cover your coughs and sneezes in some other way. Try to stay at least 6 feet away from other people. This will help protect the people around you.  Masks should not be placed on young children under age 43 years, anyone who has trouble breathing, or anyone who is not able to remove the mask without help. Note: During the COVID-19 pandemic, medical grade facemasks are reserved for healthcare workers and some first responders. Cover your coughs  and sneezes  Cover your mouth and nose with a tissue when you cough or sneeze.  Throw away used tissues in a lined trash can.  Immediately wash your hands with soap and water for at least 20 seconds. If soap and water are not available, clean your hands with an alcohol-based hand sanitizer that contains at least 60% alcohol. Clean your hands often  Wash your hands often with soap and water for at least 20 seconds. This is especially important after blowing your nose, coughing, or sneezing; going to the bathroom; and before eating or preparing food.  Use hand sanitizer if soap and water are not available. Use an alcohol-based hand sanitizer with at least 60% alcohol, covering all surfaces of your hands and rubbing them together until they feel dry.  Soap and water are the best option, especially if hands are visibly dirty.  Avoid touching your eyes, nose, and mouth with unwashed hands.  Handwashing Tips Avoid sharing personal household items  Do not share dishes, drinking glasses, cups, eating utensils, towels, or bedding with other people in your home.  Wash these items thoroughly  after using them with soap and water or put in the dishwasher. Clean all "high-touch" surfaces everyday  Clean and disinfect high-touch surfaces in your "sick room" and bathroom; wear disposable gloves. Let someone else clean and disinfect surfaces in common areas, but you should clean your bedroom and bathroom, if possible.  If a caregiver or other person needs to clean and disinfect a sick person's bedroom or bathroom, they should do so on an as-needed basis. The caregiver/other person should wear a mask and disposable gloves prior to cleaning. They should wait as long as possible after the person who is sick has used the bathroom before coming in to clean and use the bathroom. ? High-touch surfaces include phones, remote controls, counters, tabletops, doorknobs, bathroom fixtures, toilets, keyboards, tablets,  and bedside tables.  Clean and disinfect areas that may have blood, stool, or body fluids on them.  Use household cleaners and disinfectants. Clean the area or item with soap and water or another detergent if it is dirty. Then, use a household disinfectant. ? Be sure to follow the instructions on the label to ensure safe and effective use of the product. Many products recommend keeping the surface wet for several minutes to ensure germs are killed. Many also recommend precautions such as wearing gloves and making sure you have good ventilation during use of the product. ? Use a product from [employer] List N: Disinfectants for Coronavirus (COVID-19). ? Complete Disinfection Guidance When you can be around others after being sick with COVID-19 Deciding when you can be around others is different for different situations. Find out when you can safely end home isolation. For any additional questions about your care, contact your healthcare provider or state or local health department. 08/15/2019 Content source: [facility] for Immunization and Respiratory Diseases ([facility]), Division of Viral Diseases This information is not intended to replace advice given to you by your health care provider. Make sure you discuss any questions you have with your health care provider. Document Revised: 03/31/2020 Document Reviewed: 03/31/2020 Elsevier Patient Education  2021 Elsevier Inc.  Document Revised: 03/31/2020 Document Reviewed: 03/31/2020 Elsevier Patient Education  2021 [employer].

## 2020-09-09 NOTE — Progress Notes (Signed)
[name], wareing are scheduled for a virtual visit with your provider today.    Just as we do with appointments in the office, we must obtain your consent to participate.  Your consent will be active for this visit and any virtual visit you may have with one of our providers in the next 365 days.    If you have a MyChart account, I can also send a copy of this consent to you electronically.  All virtual visits are billed to your insurance company just like a traditional visit in the office.  As this is a virtual visit, video technology does not allow for your provider to perform a traditional examination.  This may limit your provider's ability to fully assess your condition.  If your provider identifies any concerns that need to be evaluated in person or the need to arrange testing such as labs, EKG, etc, we will make arrangements to do so.    Although advances in technology are sophisticated, we cannot ensure that it will always work on either your end or our end.  If the connection with a video visit is poor, we may have to switch to a telephone visit.  With either a video or telephone visit, we are not always able to ensure that we have a secure connection.   I need to obtain your verbal consent now.   Are you willing to proceed with your visit today?   Sierra Foster has provided verbal consent on 09/09/2020 for a virtual visit (video or telephone).   Sierra Loveless, PA-C 09/09/2020  10:44 AM     Virtual Visit via Video   I connected with patient on 09/09/20 at 11:00 AM EDT by a video enabled telemedicine application and verified that I am speaking with the correct person using two identifiers.  Location patient: Home Location provider: [facility] Persons participating in the virtual visit: Patient, Provider  I discussed the limitations of evaluation and management by telemedicine and the availability of in person appointments. The patient expressed understanding and  agreed to proceed.  Subjective:   HPI:   Patient presents via [facility] today for positive Covid 19. Symptoms started Friday, 09/09/20. Tested positive for Covid 19 on Sunday 09/07/20. She reports she is having head congestion, runny nose, ear pressure/fullness, headache, and dry cough. No fever today. Denies SOB, difficulty breathing, dizziness. Patient is using Delsym for cough. No other medications tried.  ROS:   See pertinent positives and negatives per HPI.  Patient Active Problem List   Diagnosis Date Noted  . Allergic conjunctivitis 07/11/2019  . Allergic rhinosinusitis 12/25/2015    Social History   Tobacco Use  . Smoking status: Never Smoker  . Smokeless tobacco: Never Used  Substance Use Topics  . Alcohol use: Yes    Comment: occasional    Current Outpatient Medications:  .  Carbinoxamine Maleate (RYVENT) 6 MG TABS, Take 6 mg by mouth every 6 (six) hours as needed. (Patient taking differently: Take 6 mg by mouth at bedtime.), Disp: 120 tablet, Rfl: 5 .  fluticasone (FLONASE) 50 MCG/ACT nasal spray, Place 2 sprays into both nostrils daily as needed for allergies or rhinitis., Disp: 18.2 mL, Rfl: 5 .  ibuprofen (ADVIL) 200 MG tablet, Take 800 mg by mouth every 6 (six) hours as needed for mild pain or fever., Disp: , Rfl:  .  ketotifen (ZADITOR) 0.025 % ophthalmic solution, Place 1 drop into both eyes at bedtime., Disp: , Rfl:  .  levonorgestrel (KYLEENA) 19.5 MG IUD, 1 Device by Intrauterine route once. Every 5 years, Disp: , Rfl:  .  meloxicam (MOBIC) 7.5 MG tablet, Take 7.5 mg by mouth daily as needed for pain., Disp: , Rfl:  .  methocarbamol (ROBAXIN) 750 MG tablet, Take 750 mg by mouth every 4 (four) hours as needed for muscle spasms., Disp: , Rfl:  .  naproxen (NAPROSYN) 500 MG tablet, Take 1 tablet (500 mg total) by mouth 2 (two) times daily. (Patient not taking: Reported on 05/25/2020), Disp: 30 tablet, Rfl: 0 .  Olopatadine HCl (PATADAY) 0.2 % SOLN, Place 1 drop  into both eyes daily as needed. (Patient not taking: Reported on 05/25/2020), Disp: 2.5 mL, Rfl: 5 .  spironolactone (ALDACTONE) 50 MG tablet, Take 50 mg by mouth daily., Disp: , Rfl:   Allergies  Allergen Reactions  . Hydrocodone-Acetaminophen     muscule spasms  . Penicillins Rash    Objective:   There were no vitals taken for this visit.  Patient is well-developed, well-nourished in no acute distress.  Resting comfortably at home.  Head is normocephalic, atraumatic.  No labored breathing.  Speech is clear and coherent with logical content.  Patient is alert and oriented at baseline.  No cough heard through video visit.  Assessment and Plan:   1. COVID-19 - Discussed viral vs bacterial and no need for antibiotics at this time - Discussed adding Mucinex or Mucinex D for congestion - Push fluids and rest as needed - Prednisone 6 day taper added for inflammation - Advised to call PCP or follow up in-person if symptoms do not improve or if they worsen - At home care advise sent via AVS in mychart  I provided 11 minutes of face-to-face time during this encounter via MyChart Video enabled encounter.  Sierra Loveless, PA-C 09/09/2020

## 2020-09-15 ENCOUNTER — Emergency Department: Admit: 2020-09-15 | Discharge status: Absent without leave | Payer: Self-pay

## 2020-09-15 ENCOUNTER — Other Ambulatory Visit: Payer: Self-pay

## 2020-09-15 ENCOUNTER — Emergency Department (INDEPENDENT_AMBULATORY_CARE_PROVIDER_SITE_OTHER)
Admission: EM | Admit: 2020-09-15 | Discharge: 2020-09-15 | Disposition: A | Discharge status: Home / Self Care | Payer: 59 | Source: Home / Self Care | Attending: Family Medicine | Admitting: Family Medicine

## 2020-09-15 DIAGNOSIS — U071 COVID-19: Secondary | ICD-10-CM | POA: Diagnosis not present

## 2020-09-15 MED ORDER — AZITHROMYCIN 250 MG PO TABS
250.0000 mg | ORAL_TABLET | Freq: Every day | ORAL | 0 refills | Status: DC
Start: 2020-09-15 — End: 2023-06-23

## 2020-09-15 NOTE — ED Triage Notes (Signed)
Patient presents to Urgent Care with complaints of nasal congestion and facial pressure since a week ago. Patient reports she tested positive for covid on 09/08/2020 and has been taking mucinex for her symptoms. Pt had a course of prednisone, which she finished yesterday.

## 2020-09-15 NOTE — ED Provider Notes (Signed)
[facility]    CSN: [id number] Arrival date & time: 09/15/20  1540      History   Chief Complaint Chief Complaint  Patient presents with  . Nasal Congestion    HPI Sierra Foster is a 40 y.o. female.   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  She started feeling sick on 09/05/2020.  She had a COVID test on 09/07/2020.  It was positive.  She has been out of work for the last 10 days.  She has been followed by health at work at the hospital and has had a video visit.  She did complete a course of steroids.  She is taking over-the-counter medicines.  In spite of this she is still feeling quite ill.  She has a lot of sinus pressure and pain.  Sore throat.  Postnasal drip.  Headache.  Fatigue.  She states she has not had a fever for almost a week.  She has been unable to go back to work.  Past Medical History:  Diagnosis Date  . Chronic ear infection    as an adult    Patient Active Problem List   Diagnosis Date Noted  . Allergic conjunctivitis 07/11/2019  . Allergic rhinosinusitis 12/25/2015    Past Surgical History:  Procedure Laterality Date  . APPENDECTOMY  1998    OB History   No obstetric history on file.      Home Medications    Prior to Admission medications   Medication Sig Start Date End Date Taking? Authorizing Provider  azithromycin (ZITHROMAX) 250 MG tablet Take 1 tablet (250 mg total) by mouth daily. Take first 2 tablets together, then 1 every day until finished. 09/15/20  Yes Eustace Moore, MD  Carbinoxamine Maleate (RYVENT) 6 MG TABS Take 6 mg by mouth every 6 (six) hours as needed. Patient taking differently: Take 6 mg by mouth at bedtime. 07/12/19   Bobbitt, Heywood Iles, MD  fluticasone (FLONASE) 50 MCG/ACT nasal spray Place 2 sprays into both nostrils daily as needed for allergies or rhinitis. 07/11/19   Bobbitt, Heywood Iles, MD   ibuprofen (ADVIL) 200 MG tablet Take 800 mg by mouth every 6 (six) hours as needed for mild pain or fever.    [provider]  ketotifen (ZADITOR) 0.025 % ophthalmic solution Place 1 drop into both eyes at bedtime.    [provider]  levonorgestrel (KYLEENA) 19.5 MG IUD 1 Device by Intrauterine route once. Every 5 years    [provider]  meloxicam (MOBIC) 7.5 MG tablet Take 7.5 mg by mouth daily as needed for pain. 05/08/20   [provider]  methocarbamol (ROBAXIN) 750 MG tablet Take 750 mg by mouth every 4 (four) hours as needed for muscle spasms. 05/08/20   [provider]  spironolactone (ALDACTONE) 50 MG tablet Take 50 mg by mouth daily. 04/09/20   [provider]    Family History Family History  Problem Relation Age of Onset  . Allergic rhinitis Father   . Hypertension Father   . Hyperlipidemia Father   . Allergic rhinitis Sister   . Asthma Neg Hx   . Eczema Neg Hx   . Urticaria Neg Hx   . Immunodeficiency Neg Hx   . Atopy Neg Hx   . Angioedema Neg Hx     Social History Social History  Tobacco Use  . Smoking status: Never Smoker  . Smokeless tobacco: Never Used  Vaping Use  . Vaping Use: Never used  Substance Use Topics  . Alcohol use: Yes    Comment: occasional  . Drug use: No     Allergies   Hydrocodone-acetaminophen and Penicillins   Review of Systems Review of Systems See HPI  Physical Exam Triage Vital Signs ED Triage Vitals  Enc Vitals Group     BP 09/15/20 1553 120/81     Pulse Rate 09/15/20 1553 81     Resp 09/15/20 1553 16     Temp 09/15/20 1553 98.6 F (37 C)     Temp Source 09/15/20 1553 Oral     SpO2 09/15/20 1553 99 %     Weight --      Height --      Head Circumference --      Peak Flow --      Pain Score 09/15/20 1551 6     Pain Loc --      Pain Edu? --      Excl. in GC? --    No data found.  Updated Vital Signs BP 120/81 (BP Location: Right Arm)   Pulse 81   Temp  98.6 F (37 C) (Oral)   Resp 16   SpO2 99%      Physical Exam Constitutional:      General: She is not in acute distress.    Appearance: Normal appearance. She is well-developed and normal weight.     Comments: Appears tired.  Mask in place  HENT:     Head: Normocephalic and atraumatic.     Right Ear: Tympanic membrane and ear canal normal.     Left Ear: Tympanic membrane and ear canal normal.     Nose: Congestion present.     Mouth/Throat:     Pharynx: Posterior oropharyngeal erythema present.  Eyes:     Conjunctiva/sclera: Conjunctivae normal.     Pupils: Pupils are equal, round, and reactive to light.  Cardiovascular:     Rate and Rhythm: Normal rate and regular rhythm.     Heart sounds: Normal heart sounds.  Pulmonary:     Effort: Pulmonary effort is normal. No respiratory distress.     Breath sounds: Normal breath sounds.  Abdominal:     Palpations: Abdomen is soft.  Musculoskeletal:        General: Normal range of motion.     Cervical back: Normal range of motion.  Lymphadenopathy:     Cervical: No cervical adenopathy.  Skin:    General: Skin is warm and dry.  Neurological:     Mental Status: She is alert.  Psychiatric:        Behavior: Behavior normal.      UC Treatments / Results  Labs (all labs ordered are listed, but only abnormal results are displayed) Labs Reviewed - No data to display  EKG   Radiology No results found.  Procedures Procedures (including critical care time)  Medications Ordered in UC Medications - No data to display  Initial Impression / Assessment and Plan / UC Course  I have reviewed the triage vital signs and the nursing notes.  Pertinent labs & imaging results that were available during my care of the patient were reviewed by me and considered in my medical decision making (see chart for details).     Patient states she is having worsening symptoms 10 days into her illness.  Even though we  know that she has COVID, and  that this is a virus I am going to give her a trial of antibiotics to see if it makes her feel better.  Continue over-the-counter medicines for symptomatic care.  Follow-up with primary care Final Clinical Impressions(s) / UC Diagnoses   Final diagnoses:  COVID-19     Discharge Instructions     Continue to drink plenty of fluids Take the antibiotic azithromycin as directed.  2 today then 1 a day until gone Make sure you are using your Flonase twice a day while you have symptoms then can reduce to once a day again Most of your symptoms are still caused by COVID.  It is uncertain when they will resolve  You need to have a primary care doctor to fill out matrix forms.  It is possible occupational health doctors can fill them out for you.  I can only sign a note for you to be off 3 days   ED Prescriptions    Medication Sig Dispense Auth. Provider   azithromycin (ZITHROMAX) 250 MG tablet Take 1 tablet (250 mg total) by mouth daily. Take first 2 tablets together, then 1 every day until finished. 6 tablet Eustace Moore, MD     PDMP not reviewed this encounter.   Eustace Moore, MD 09/15/20 [phone]

## 2020-09-15 NOTE — Discharge Instructions (Addendum)
Continue to drink plenty of fluids Take the antibiotic azithromycin as directed.  2 today then 1 a day until gone Make sure you are using your Flonase twice a day while you have symptoms then can reduce to once a day again Most of your symptoms are still caused by COVID.  It is uncertain when they will resolve  You need to have a primary care doctor to fill out matrix forms.  It is possible occupational health doctors can fill them out for you.  I can only sign a note for you to be off 3 days

## 2020-09-17 ENCOUNTER — Ambulatory Visit (INDEPENDENT_AMBULATORY_CARE_PROVIDER_SITE_OTHER): Payer: 59 | Admitting: Nurse Practitioner

## 2020-09-17 DIAGNOSIS — U071 COVID-19: Secondary | ICD-10-CM

## 2020-09-17 NOTE — Progress Notes (Signed)
@Patient  ID: , female    DOB: 1980-07-10, 40 y.o.   MRN: [id number]  Chief Complaint  Patient presents with  . Covid Positive    Referring provider: No ref. provider found     HPI  Patient presents today for post-COVID care clinic visit.  She was diagnosed with COVID on 09/07/2020.  She has been seen by urgent care and was prescribed azithromycin.  She states that she is much improved.  She plans to return to work on Monday.  She does need FMLA paperwork filled out today. Denies f/c/s, n/v/d, hemoptysis, PND, chest pain or edema.      Allergies  Allergen Reactions  . Hydrocodone-Acetaminophen     muscule spasms  . Penicillins Rash    Immunization History  Administered Date(s) Administered  . Influenza,inj,Quad PF,6+ Mos 02/22/2020    Past Medical History:  Diagnosis Date  . Chronic ear infection    as an adult    Tobacco History: Social History   Tobacco Use  Smoking Status Never Smoker  Smokeless Tobacco Never Used   Counseling given: Not Answered   Outpatient Encounter Medications as of 09/17/2020  Medication Sig  . azithromycin (ZITHROMAX) 250 MG tablet Take 1 tablet (250 mg total) by mouth daily. Take first 2 tablets together, then 1 every day until finished.  . Carbinoxamine Maleate (RYVENT) 6 MG TABS Take 6 mg by mouth every 6 (six) hours as needed. (Patient taking differently: Take 6 mg by mouth at bedtime.)  . fluticasone (FLONASE) 50 MCG/ACT nasal spray Place 2 sprays into both nostrils daily as needed for allergies or rhinitis.  09/19/2020 ibuprofen (ADVIL) 200 MG tablet Take 800 mg by mouth every 6 (six) hours as needed for mild pain or fever.  Marland Kitchen ketotifen (ZADITOR) 0.025 % ophthalmic solution Place 1 drop into both eyes at bedtime.  Marland Kitchen levonorgestrel (KYLEENA) 19.5 MG IUD 1 Device by Intrauterine route once. Every 5 years  . meloxicam (MOBIC) 7.5 MG tablet Take 7.5 mg by mouth daily as needed for pain.  . methocarbamol (ROBAXIN) 750 MG tablet Take  750 mg by mouth every 4 (four) hours as needed for muscle spasms.  Marland Kitchen spironolactone (ALDACTONE) 50 MG tablet Take 50 mg by mouth daily.   No facility-administered encounter medications on file as of 09/17/2020.     Review of Systems  Review of Systems  Constitutional: Negative.   HENT: Positive for congestion.   Respiratory: Negative for cough and shortness of breath.   Cardiovascular: Negative.  Negative for chest pain, palpitations and leg swelling.  Gastrointestinal: Negative.   Allergic/Immunologic: Negative.   Neurological: Negative.   Psychiatric/Behavioral: Negative.        Physical Exam  There were no vitals taken for this visit.  Wt Readings from Last 5 Encounters:  05/25/20 158 lb (71.7 kg)  07/11/19 158 lb 12.8 oz (72 kg)  02/22/18 153 lb (69.4 kg)  12/27/17 151 lb (68.5 kg)  12/20/17 152 lb 6.4 oz (69.1 kg)     Physical Exam Vitals and nursing note reviewed.  Constitutional:      General: She is not in acute distress.    Appearance: She is well-developed.  Cardiovascular:     Rate and Rhythm: Normal rate and regular rhythm.  Pulmonary:     Effort: Pulmonary effort is normal.     Breath sounds: Normal breath sounds.  Musculoskeletal:     Right lower leg: No edema.     Left lower leg: No edema.  Neurological:     Mental Status: She is alert and oriented to person, place, and time.  Psychiatric:        Mood and Affect: Mood normal.        Behavior: Behavior normal.        Assessment & Plan:   COVID-19 Cough:   Stay well hydrated  Stay active  Deep breathing exercises  May start vitamin C daily, vitamin D3 daily, Zinc daily  May take tylenol for fever or pain     Follow up:  Follow up if needed      Ivonne Andrew, NP 09/18/2020

## 2020-09-17 NOTE — Patient Instructions (Signed)
Covid 19 Cough:   Stay well hydrated  Stay active  Deep breathing exercises  May start vitamin C daily, vitamin D3 daily, Zinc daily  May take tylenol for fever or pain    Follow up:  Follow up if needed  

## 2020-09-18 DIAGNOSIS — U071 COVID-19: Secondary | ICD-10-CM | POA: Insufficient documentation

## 2020-09-18 NOTE — Assessment & Plan Note (Signed)
Cough:   Stay well hydrated  Stay active  Deep breathing exercises  May start vitamin C daily, vitamin D3 daily, Zinc daily  May take tylenol for fever or pain    Follow up:  Follow up if needed  

## 2020-11-13 DIAGNOSIS — K581 Irritable bowel syndrome with constipation: Secondary | ICD-10-CM | POA: Diagnosis not present

## 2020-11-13 DIAGNOSIS — Z0001 Encounter for general adult medical examination with abnormal findings: Secondary | ICD-10-CM | POA: Diagnosis not present

## 2020-11-13 DIAGNOSIS — L708 Other acne: Secondary | ICD-10-CM | POA: Diagnosis not present

## 2020-11-13 DIAGNOSIS — E559 Vitamin D deficiency, unspecified: Secondary | ICD-10-CM | POA: Diagnosis not present

## 2020-11-13 DIAGNOSIS — M5136 Other intervertebral disc degeneration, lumbar region: Secondary | ICD-10-CM | POA: Diagnosis not present

## 2020-11-13 DIAGNOSIS — B009 Herpesviral infection, unspecified: Secondary | ICD-10-CM | POA: Diagnosis not present

## 2020-12-11 DIAGNOSIS — H5213 Myopia, bilateral: Secondary | ICD-10-CM | POA: Diagnosis not present

## 2020-12-15 DIAGNOSIS — J4541 Moderate persistent asthma with (acute) exacerbation: Secondary | ICD-10-CM | POA: Diagnosis not present

## 2020-12-15 DIAGNOSIS — J209 Acute bronchitis, unspecified: Secondary | ICD-10-CM | POA: Diagnosis not present

## 2020-12-18 DIAGNOSIS — Z01419 Encounter for gynecological examination (general) (routine) without abnormal findings: Secondary | ICD-10-CM | POA: Diagnosis not present

## 2020-12-18 DIAGNOSIS — Z760 Encounter for issue of repeat prescription: Secondary | ICD-10-CM | POA: Diagnosis not present

## 2020-12-18 DIAGNOSIS — Z6826 Body mass index (BMI) 26.0-26.9, adult: Secondary | ICD-10-CM | POA: Diagnosis not present

## 2020-12-25 DIAGNOSIS — Z0001 Encounter for general adult medical examination with abnormal findings: Secondary | ICD-10-CM | POA: Diagnosis not present

## 2021-02-25 DIAGNOSIS — M9905 Segmental and somatic dysfunction of pelvic region: Secondary | ICD-10-CM | POA: Diagnosis not present

## 2021-02-25 DIAGNOSIS — M9903 Segmental and somatic dysfunction of lumbar region: Secondary | ICD-10-CM | POA: Diagnosis not present

## 2021-02-25 DIAGNOSIS — M9904 Segmental and somatic dysfunction of sacral region: Secondary | ICD-10-CM | POA: Diagnosis not present

## 2021-02-25 DIAGNOSIS — M545 Low back pain, unspecified: Secondary | ICD-10-CM | POA: Diagnosis not present

## 2021-02-25 DIAGNOSIS — M7918 Myalgia, other site: Secondary | ICD-10-CM | POA: Diagnosis not present

## 2021-05-01 DIAGNOSIS — M5416 Radiculopathy, lumbar region: Secondary | ICD-10-CM | POA: Diagnosis not present

## 2021-05-01 DIAGNOSIS — M62830 Muscle spasm of back: Secondary | ICD-10-CM | POA: Diagnosis not present

## 2021-05-07 DIAGNOSIS — M5440 Lumbago with sciatica, unspecified side: Secondary | ICD-10-CM | POA: Diagnosis not present

## 2021-05-12 IMAGING — CT CT RENAL STONE PROTOCOL
2 of 4 series · 15 of 46 positions shown, 17 images · non-contrast
Comparison: CT 06/24/2010

CLINICAL DATA: Right flank pain, chills, fever

EXAM:
CT ABDOMEN AND PELVIS WITHOUT CONTRAST
TECHNIQUE: Multidetector CT imaging of the abdomen and pelvis was performed
following the standard protocol without IV contrast.

[Series 2: axial st · axial · 0.69mm/px · z∈[-519,-94]mm · 12 of 96 slices shown, 14 images]
[im 6/96  soft-tissue]
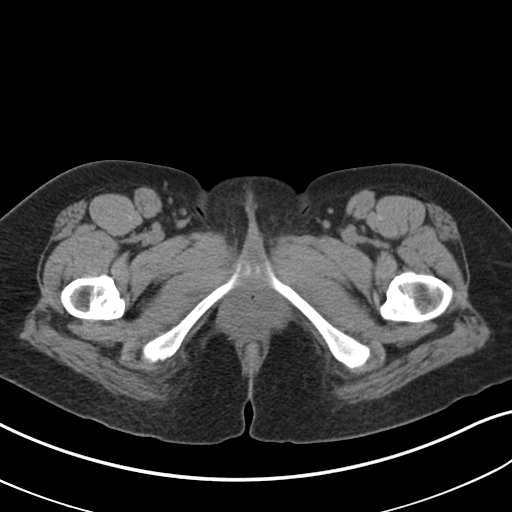
[im 6/96  bone]
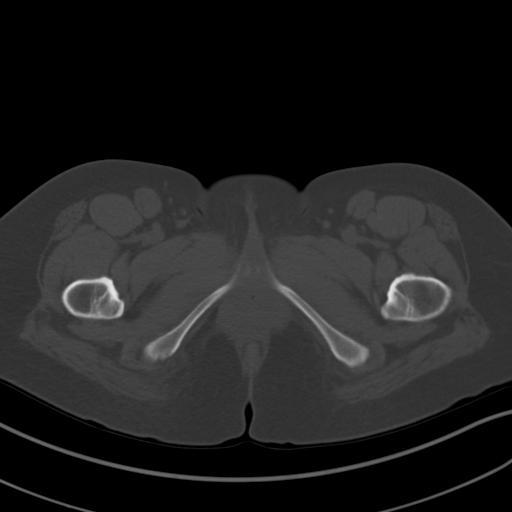
[im 16/96  soft-tissue]
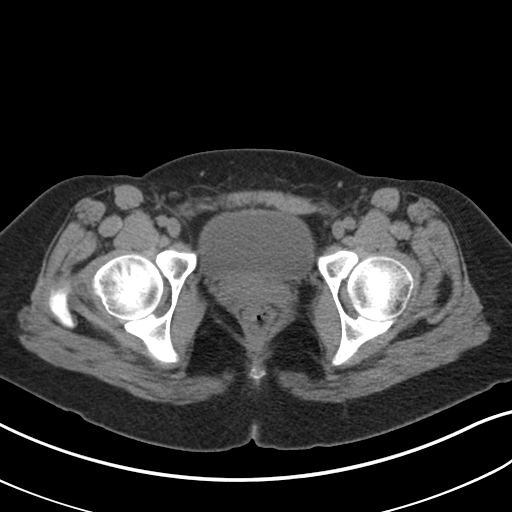
[im 21/96  soft-tissue]
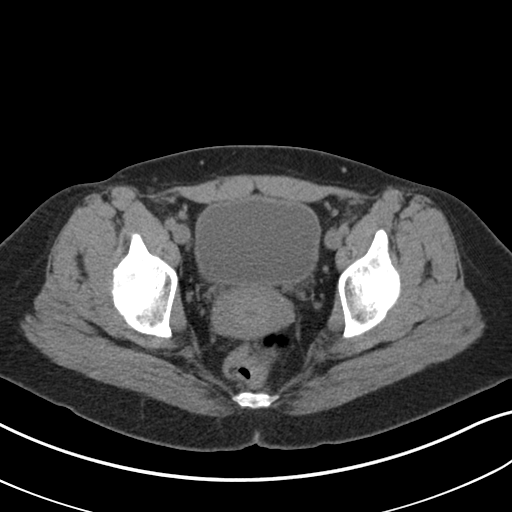
[im 31/96  soft-tissue]
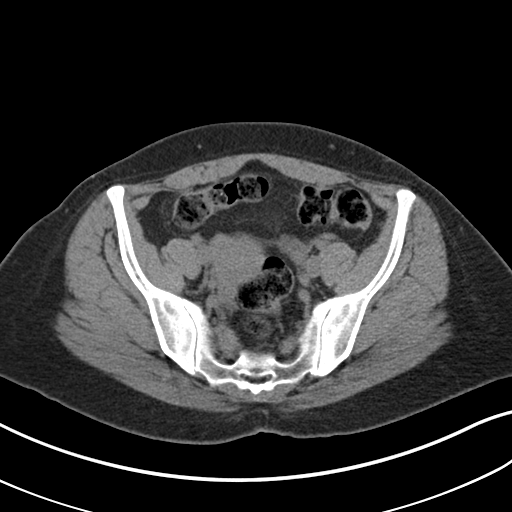
[im 36/96  soft-tissue]
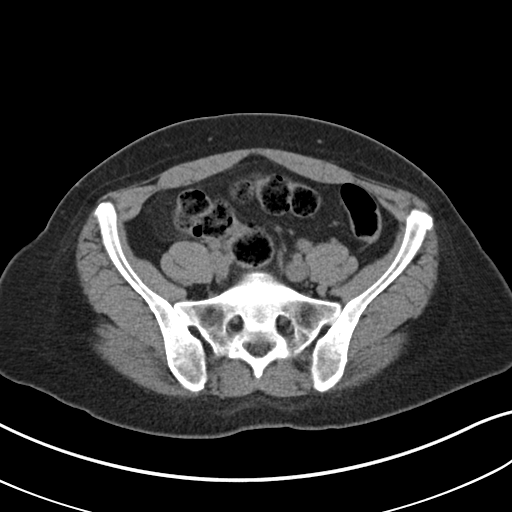
[im 46/96  soft-tissue]
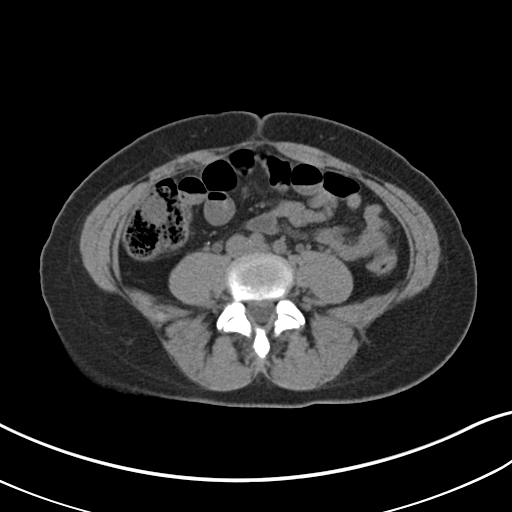
[im 51/96  soft-tissue]
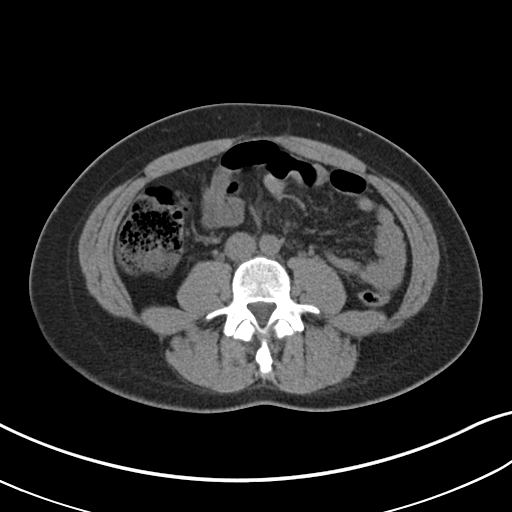
[im 61/96  soft-tissue]
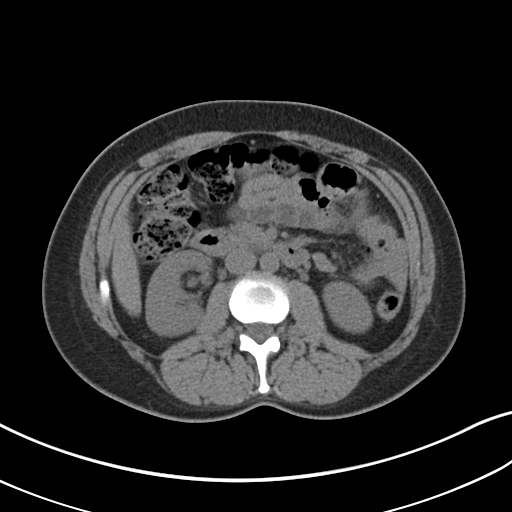
[im 66/96  soft-tissue]
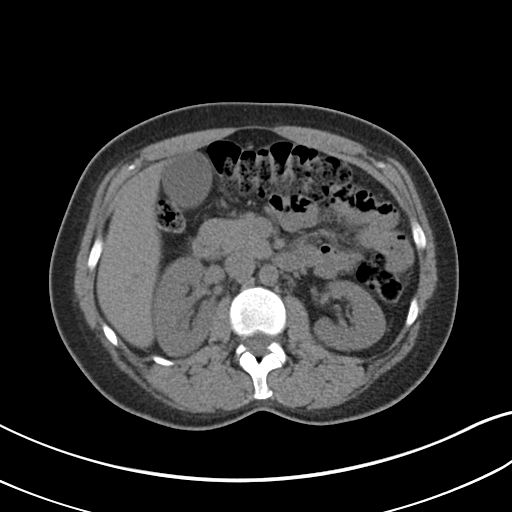
[im 66/96  bone]
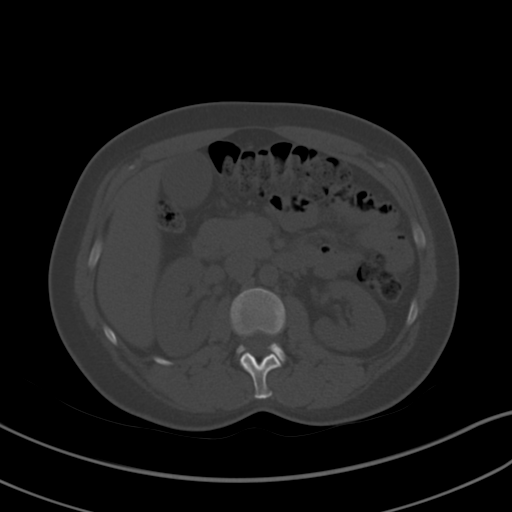
[im 76/96  soft-tissue]
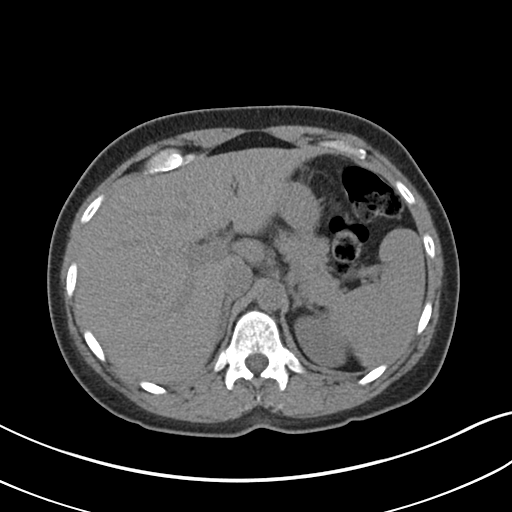
[im 81/96  soft-tissue]
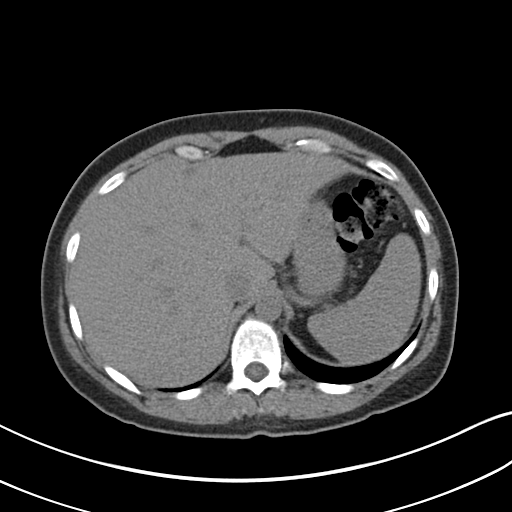
[im 91/96  soft-tissue]
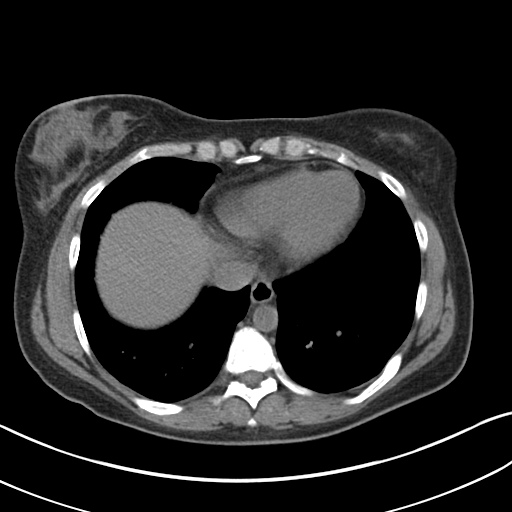

[Series 5: coronal · coronal · 0.58mm/px · 3 of 120 slices shown]
[im 40/120  soft-tissue]
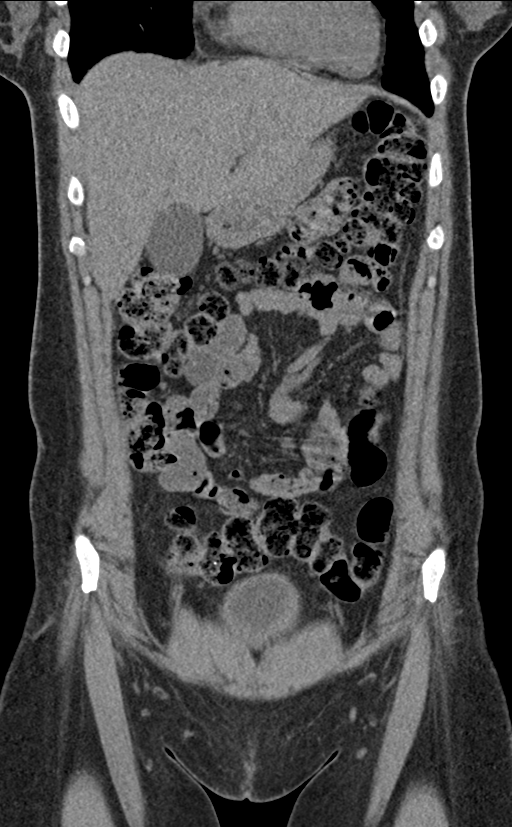
[im 53/120  soft-tissue]
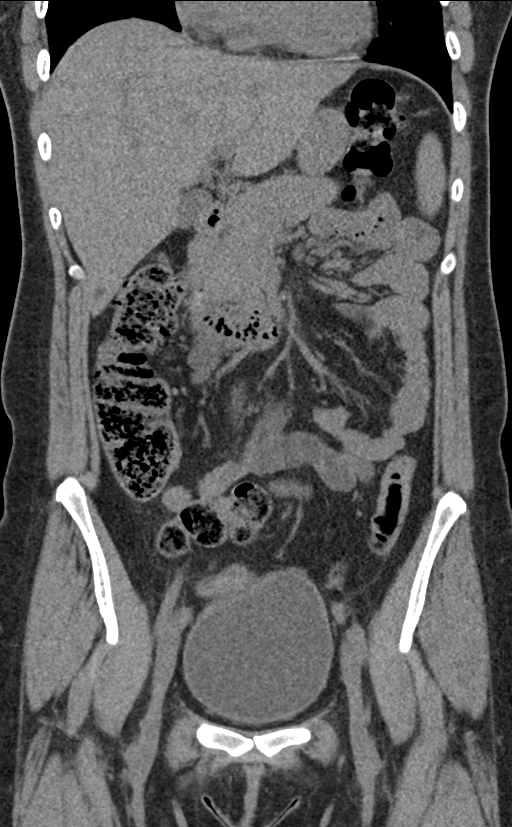
[im 67/120  soft-tissue]
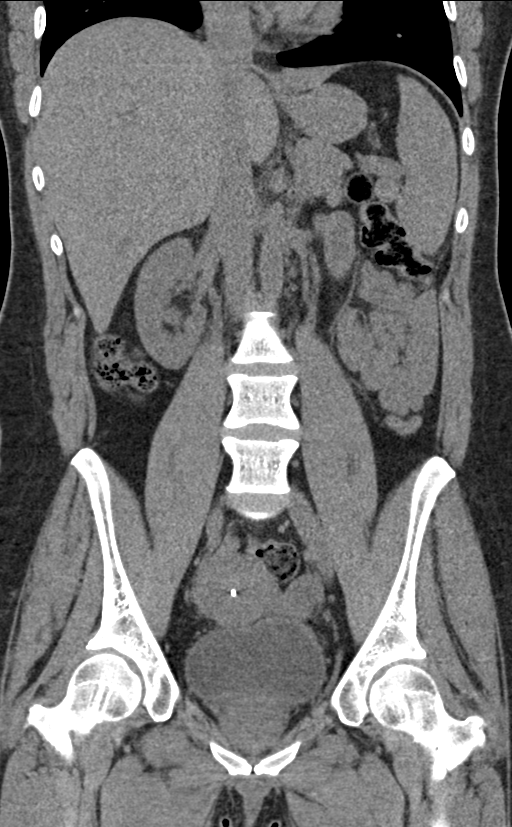

[15 of 46 positions shown; findings below may reference images not displayed]

FINDINGS: Lower chest: No acute abnormality.

Hepatobiliary: Gallbladder unremarkable. Probable hepatic cysts. No
biliary ductal dilatation.

Pancreas: Unremarkable. No pancreatic ductal dilatation or
surrounding inflammatory changes.

Spleen: Normal in size without focal abnormality.

Adrenals/Urinary Tract: Adrenal glands unremarkable. Renal contours
symmetric. There is mild right pelvicaliectasis and proximal
ureterectasis seen to the level of the SI joint, decompressed
distally, with no urolithiasis identified. Urinary bladder partially
distended, unremarkable.

Stomach/Bowel: Stomach is decompressed. Small bowel is nondilated.
Clips from previous appendectomy. Colon is nondilated, unremarkable.

Vascular/Lymphatic: No significant vascular findings are present. No
enlarged abdominal or pelvic lymph nodes.

Reproductive: IUD in place.  No adnexal mass.

Other: Left pelvic phleboliths.  No ascites.  No free air.

Musculoskeletal: No acute or significant osseous findings.
IMPRESSION: Mild right pelvicaliectasis and proximal ureterectasis to the level
of the SI joint, with no urolithiasis identified. Correlate with any
clinical or laboratory evidence of recent stone passage or UTI.

## 2021-06-11 DIAGNOSIS — M5416 Radiculopathy, lumbar region: Secondary | ICD-10-CM | POA: Diagnosis not present

## 2021-06-11 DIAGNOSIS — M5440 Lumbago with sciatica, unspecified side: Secondary | ICD-10-CM | POA: Diagnosis not present

## 2021-07-09 DIAGNOSIS — Z6827 Body mass index (BMI) 27.0-27.9, adult: Secondary | ICD-10-CM | POA: Diagnosis not present

## 2021-07-09 DIAGNOSIS — M5440 Lumbago with sciatica, unspecified side: Secondary | ICD-10-CM | POA: Diagnosis not present

## 2021-09-10 DIAGNOSIS — H608X3 Other otitis externa, bilateral: Secondary | ICD-10-CM | POA: Diagnosis not present

## 2021-09-10 DIAGNOSIS — H6123 Impacted cerumen, bilateral: Secondary | ICD-10-CM | POA: Diagnosis not present

## 2021-11-05 DIAGNOSIS — K602 Anal fissure, unspecified: Secondary | ICD-10-CM | POA: Diagnosis not present

## 2021-11-05 DIAGNOSIS — K5904 Chronic idiopathic constipation: Secondary | ICD-10-CM | POA: Diagnosis not present

## 2021-11-05 DIAGNOSIS — K625 Hemorrhage of anus and rectum: Secondary | ICD-10-CM | POA: Diagnosis not present

## 2021-12-16 DIAGNOSIS — H5213 Myopia, bilateral: Secondary | ICD-10-CM | POA: Diagnosis not present

## 2022-01-07 DIAGNOSIS — Z6827 Body mass index (BMI) 27.0-27.9, adult: Secondary | ICD-10-CM | POA: Diagnosis not present

## 2022-01-07 DIAGNOSIS — Z1231 Encounter for screening mammogram for malignant neoplasm of breast: Secondary | ICD-10-CM | POA: Diagnosis not present

## 2022-01-07 DIAGNOSIS — Z01419 Encounter for gynecological examination (general) (routine) without abnormal findings: Secondary | ICD-10-CM | POA: Diagnosis not present

## 2022-01-07 DIAGNOSIS — Z124 Encounter for screening for malignant neoplasm of cervix: Secondary | ICD-10-CM | POA: Diagnosis not present

## 2022-01-12 ENCOUNTER — Other Ambulatory Visit (HOSPITAL_COMMUNITY): Payer: Self-pay

## 2022-01-12 ENCOUNTER — Other Ambulatory Visit: Payer: Self-pay | Admitting: Obstetrics and Gynecology

## 2022-01-12 DIAGNOSIS — R928 Other abnormal and inconclusive findings on diagnostic imaging of breast: Secondary | ICD-10-CM

## 2022-01-12 MED ORDER — BLOOD PRESSURE MONITOR 3 DEVI
0 refills | Status: AC
  Filled 2022-01-12: qty 1, 30d supply, fill #0

## 2022-02-04 ENCOUNTER — Ambulatory Visit
Admission: RE | Admit: 2022-02-04 | Discharge: 2022-02-04 | Disposition: A | Discharge status: Home / Self Care | Payer: 59 | Source: Ambulatory Visit | Attending: Obstetrics and Gynecology | Admitting: Obstetrics and Gynecology

## 2022-02-04 DIAGNOSIS — R928 Other abnormal and inconclusive findings on diagnostic imaging of breast: Secondary | ICD-10-CM

## 2022-02-04 DIAGNOSIS — N6011 Diffuse cystic mastopathy of right breast: Secondary | ICD-10-CM | POA: Diagnosis not present

## 2022-02-04 DIAGNOSIS — R922 Inconclusive mammogram: Secondary | ICD-10-CM | POA: Diagnosis not present

## 2022-02-04 DIAGNOSIS — N6012 Diffuse cystic mastopathy of left breast: Secondary | ICD-10-CM | POA: Diagnosis not present

## 2022-03-31 DIAGNOSIS — H608X3 Other otitis externa, bilateral: Secondary | ICD-10-CM | POA: Diagnosis not present

## 2022-03-31 DIAGNOSIS — H6123 Impacted cerumen, bilateral: Secondary | ICD-10-CM | POA: Diagnosis not present

## 2022-04-23 DIAGNOSIS — N390 Urinary tract infection, site not specified: Secondary | ICD-10-CM | POA: Diagnosis not present

## 2022-05-02 DIAGNOSIS — J069 Acute upper respiratory infection, unspecified: Secondary | ICD-10-CM | POA: Diagnosis not present

## 2022-05-02 DIAGNOSIS — Z6827 Body mass index (BMI) 27.0-27.9, adult: Secondary | ICD-10-CM | POA: Diagnosis not present

## 2022-05-02 DIAGNOSIS — R52 Pain, unspecified: Secondary | ICD-10-CM | POA: Diagnosis not present

## 2022-05-05 DIAGNOSIS — B349 Viral infection, unspecified: Secondary | ICD-10-CM | POA: Diagnosis not present

## 2022-05-05 DIAGNOSIS — R051 Acute cough: Secondary | ICD-10-CM | POA: Diagnosis not present

## 2022-05-05 DIAGNOSIS — R062 Wheezing: Secondary | ICD-10-CM | POA: Diagnosis not present

## 2022-06-29 DIAGNOSIS — M5440 Lumbago with sciatica, unspecified side: Secondary | ICD-10-CM | POA: Diagnosis not present

## 2022-07-13 DIAGNOSIS — M5416 Radiculopathy, lumbar region: Secondary | ICD-10-CM | POA: Diagnosis not present

## 2022-07-27 DIAGNOSIS — M5416 Radiculopathy, lumbar region: Secondary | ICD-10-CM | POA: Diagnosis not present

## 2022-08-12 ENCOUNTER — Other Ambulatory Visit: Payer: Self-pay

## 2022-08-12 ENCOUNTER — Ambulatory Visit (INDEPENDENT_AMBULATORY_CARE_PROVIDER_SITE_OTHER): Payer: 59 | Admitting: Dermatology

## 2022-08-12 ENCOUNTER — Ambulatory Visit: Payer: 59 | Admitting: Dermatology

## 2022-08-12 ENCOUNTER — Encounter: Payer: Self-pay | Admitting: Dermatology

## 2022-08-12 DIAGNOSIS — L719 Rosacea, unspecified: Secondary | ICD-10-CM | POA: Diagnosis not present

## 2022-08-12 DIAGNOSIS — D239 Other benign neoplasm of skin, unspecified: Secondary | ICD-10-CM

## 2022-08-12 DIAGNOSIS — D489 Neoplasm of uncertain behavior, unspecified: Secondary | ICD-10-CM | POA: Diagnosis not present

## 2022-08-12 HISTORY — DX: Other benign neoplasm of skin, unspecified: D23.9

## 2022-08-12 MED ORDER — AZELAIC ACID 15 % EX GEL: 1.0000 | Freq: Every day | CUTANEOUS | 0 refills | Status: AC

## 2022-08-12 MED ORDER — METRONIDAZOLE 0.75 % EX CREA: TOPICAL_CREAM | Freq: Every day | CUTANEOUS | 0 refills | Status: AC

## 2022-08-12 NOTE — Patient Instructions (Addendum)
Patient Handout: Wound Care for Skin Biopsy Site  Patient Handout: Wound Care for Skin Biopsy Site  Taking Care of Your Skin Biopsy Site  Proper care of the biopsy site is essential for promoting healing and minimizing scarring. This handout provides instructions on how to care for your biopsy site to ensure optimal recovery.  1. Cleaning the Wound:  Clean the biopsy site daily with gentle soap and water. Gently pat the area dry with a clean, soft towel. Avoid harsh scrubbing or rubbing the area, as this can irritate the skin and delay healing.  2. Applying Aquaphor and Bandage:  After cleaning the wound, apply a thin layer of Aquaphor ointment to the biopsy site. Cover the area with a sterile bandage to protect it from dirt, bacteria, and friction. Change the bandage daily or as needed if it becomes soiled or wet.  3. Continued Care for One Week:  Repeat the cleaning, Aquaphor application, and bandaging process daily for one week following the biopsy procedure. Keeping the wound clean and moist during this initial healing period will help prevent infection and promote optimal healing.  4. Massaging Aquaphor into the Area:  ---After one week, discontinue the use of bandages but continue to apply Aquaphor to the biopsy site. ----Gently massage the Aquaphor into the area using circular motions. ---Massaging the skin helps to promote circulation and prevent the formation of scar tissue.   Additional Tips:  Avoid exposing the biopsy site to direct sunlight during the healing process, as this can cause hyperpigmentation or worsen scarring. If you experience any signs of infection, such as increased redness, swelling, warmth, or drainage from the wound, contact your healthcare provider immediately. Follow any additional instructions provided by your healthcare provider for caring for the biopsy site and managing any discomfort. Conclusion:  Taking proper care of your skin biopsy site  is crucial for ensuring optimal healing and minimizing scarring. By following these instructions for cleaning, applying Aquaphor, and massaging the area, you can promote a smooth and successful recovery. If you have any questions or concerns about caring for your biopsy site, don't hesitate to contact your healthcare provider for guidance.   

## 2022-08-12 NOTE — Progress Notes (Signed)
New Patient Visit  Subjective  Sierra Foster is a 42 y.o. female who presents for the following: Annual Exam (Spot on leg. Last seen a dermatologist before covid. Mom has hx of skin cancer. On Spirolactone for side effect from IUD. No side effects from that. ).    Objective  Well appearing patient in no apparent distress; mood and affect are within normal limits.  A full examination was performed including scalp, head, eyes, ears, nose, lips, neck, chest, axillae, abdomen, back, buttocks, bilateral upper extremities, bilateral lower extremities, hands, feet, fingers, toes, fingernails, and toenails. All findings within normal limits unless otherwise noted below.  Lentigines - Scattered tan macules - Due to sun exposure - Benign-appearing, observe - Recommend daily broad spectrum sunscreen SPF 30+ to sun-exposed areas, reapply every 2 hours as needed. - Call for any changes  Seborrheic Keratoses - Stuck-on, waxy, tan-brown papules and/or plaques  - Benign-appearing - Discussed benign etiology and prognosis. - Observe - Call for any changes  Melanocytic Nevi - Tan-brown and/or pink-flesh-colored symmetric macules and papules - Benign appearing on exam today - Observation - Call clinic for new or changing moles - Recommend daily use of broad spectrum spf 30+ sunscreen to sun-exposed areas.   Hemangiomas - Red papules - Discussed benign nature - Observe - Call for any changes  Actinic Damage - Chronic condition, secondary to cumulative UV/sun exposure - diffuse scaly erythematous macules with underlying dyspigmentation - Recommend daily broad spectrum sunscreen SPF 30+ to sun-exposed areas, reapply every 2 hours as needed.  - Staying in the shade or wearing long sleeves, sun glasses (UVA+UVB protection) and wide brim hats (4-inch brim around the entire circumference of the hat) are also recommended for sun protection.  - Call for new or changing lesions.  Skin cancer  screening performed today.   Right Breast Irregular brown macule   Left Buccal Cheek, Right Buccal Cheek Mid face erythema with telangiectasias , - scattered inflammatory papules.    Assessment & Plan  Neoplasm of uncertain behavior Right Breast  Skin / nail biopsy Type of biopsy: tangential    Specimen 1 - Surgical pathology Differential Diagnosis: BCC  Check Margins: No  Rosacea Left Buccal Cheek; Right Buccal Cheek  Pt Counseling: Rosacea is a chronic inflammatory skin condition predominantly affecting the central face and most often starts between the age of 30-60 years.  Rosacea is common and is characterised by persistent facial redness. It typically has a relapsing and remitting course, with symptoms controlled by lifestyle measures, general skin care, medications, and procedural interventions.         Counseling for BBL / IPL / Laser and Coordination of Care Discussed the treatment option of Broad Band Light (BBL) /Intense Pulsed Light (IPL)/ Laser for skin discoloration, including brown spots and redness.  Typically we recommend at least 1-3 treatment sessions about 5-8 weeks apart for best results.  Cannot have tanned skin when BBL performed, and regular use of sunscreen is advised after the procedure to help maintain results. The patient's condition may also require "maintenance treatments" in the future.  The fee for BBL / laser treatments is $350 per treatment session for the whole face.  A fee can be quoted for other parts of the body.  Insurance typically does not pay for BBL/laser treatments and therefore the fee is an out-of-pocket cost. ---> Pt aware  BBL laser in in [place] office    RX topical Treatment:  Metrogel to face every morning and Finacea  15% gel to face every evening.  Related Medications metroNIDAZOLE (METROCREAM) 0.75 % cream Apply topically daily.  Azelaic Acid 15 % gel Apply 1 Application topically daily. After skin is thoroughly  washed and patted dry, gently but thoroughly massage a thin film of azelaic acid cream into the affected area twice daily, in the morning and .   Return in about 6 months (around 02/12/2023) for Rosacea follow up, 1 year for anual skin check.  I, Zigmund Gottron, CMA, am acting as scribe for Ellard Artis, MD.

## 2022-09-10 NOTE — Progress Notes (Signed)
Hi Sierra Foster  Dr. Onalee Hua reviewed your biopsy results and they showed the spot removed was benign (not cancerous).  No additional treatment is required.  The detailed report is available to view in MyChart.  Have a great day!  Kind Regards,  Dr. Kermit Balo Care Team

## 2022-09-15 DIAGNOSIS — F40248 Other situational type phobia: Secondary | ICD-10-CM | POA: Diagnosis not present

## 2022-09-15 DIAGNOSIS — F411 Generalized anxiety disorder: Secondary | ICD-10-CM | POA: Diagnosis not present

## 2022-12-15 ENCOUNTER — Other Ambulatory Visit (HOSPITAL_COMMUNITY): Payer: Self-pay

## 2022-12-15 MED ORDER — VALACYCLOVIR HCL 500 MG PO TABS
500.0000 mg | ORAL_TABLET | Freq: Every day | ORAL | 3 refills | Status: DC
  Filled 2022-12-15: qty 30, 30d supply, fill #0

## 2022-12-15 MED ORDER — SPIRONOLACTONE 50 MG PO TABS: 50.0000 mg | ORAL_TABLET | Freq: Every day | ORAL | 1 refills | Status: AC

## 2022-12-15 MED ORDER — MOMETASONE FUROATE 0.1 % EX CREA
TOPICAL_CREAM | Freq: Every day | CUTANEOUS | 4 refills | Status: AC | PRN
  Filled 2022-12-15: qty 45, 30d supply, fill #0

## 2022-12-15 MED ORDER — TIZANIDINE HCL 4 MG PO TABS
4.0000 mg | ORAL_TABLET | Freq: Four times a day (QID) | ORAL | 1 refills | Status: AC | PRN
  Filled 2023-05-04: qty 45, 12d supply, fill #0

## 2022-12-23 DIAGNOSIS — F411 Generalized anxiety disorder: Secondary | ICD-10-CM | POA: Diagnosis not present

## 2022-12-23 DIAGNOSIS — F40248 Other situational type phobia: Secondary | ICD-10-CM | POA: Diagnosis not present

## 2022-12-23 DIAGNOSIS — H43813 Vitreous degeneration, bilateral: Secondary | ICD-10-CM | POA: Diagnosis not present

## 2022-12-23 DIAGNOSIS — H5213 Myopia, bilateral: Secondary | ICD-10-CM | POA: Diagnosis not present

## 2022-12-31 ENCOUNTER — Other Ambulatory Visit (HOSPITAL_COMMUNITY): Payer: Self-pay

## 2023-01-19 ENCOUNTER — Other Ambulatory Visit (HOSPITAL_COMMUNITY): Payer: Self-pay

## 2023-01-19 MED ORDER — SPIRONOLACTONE 50 MG PO TABS
50.0000 mg | ORAL_TABLET | Freq: Every day | ORAL | 0 refills | Status: DC
  Filled 2023-01-19: qty 90, 90d supply, fill #0

## 2023-01-20 ENCOUNTER — Other Ambulatory Visit (HOSPITAL_COMMUNITY): Payer: Self-pay

## 2023-02-10 DIAGNOSIS — Z6828 Body mass index (BMI) 28.0-28.9, adult: Secondary | ICD-10-CM | POA: Diagnosis not present

## 2023-02-10 DIAGNOSIS — Z01419 Encounter for gynecological examination (general) (routine) without abnormal findings: Secondary | ICD-10-CM | POA: Diagnosis not present

## 2023-02-10 DIAGNOSIS — Z1231 Encounter for screening mammogram for malignant neoplasm of breast: Secondary | ICD-10-CM | POA: Diagnosis not present

## 2023-02-17 ENCOUNTER — Ambulatory Visit (INDEPENDENT_AMBULATORY_CARE_PROVIDER_SITE_OTHER): Payer: 59 | Admitting: Dermatology

## 2023-02-17 ENCOUNTER — Encounter: Payer: Self-pay | Admitting: Dermatology

## 2023-02-17 DIAGNOSIS — K602 Anal fissure, unspecified: Secondary | ICD-10-CM | POA: Insufficient documentation

## 2023-02-17 DIAGNOSIS — M5416 Radiculopathy, lumbar region: Secondary | ICD-10-CM | POA: Insufficient documentation

## 2023-02-17 DIAGNOSIS — L719 Rosacea, unspecified: Secondary | ICD-10-CM

## 2023-02-17 DIAGNOSIS — K625 Hemorrhage of anus and rectum: Secondary | ICD-10-CM | POA: Insufficient documentation

## 2023-02-17 DIAGNOSIS — K5904 Chronic idiopathic constipation: Secondary | ICD-10-CM | POA: Insufficient documentation

## 2023-02-17 MED ORDER — IVERMECTIN 1 % EX CREA
1.0000 | TOPICAL_CREAM | Freq: Every day | CUTANEOUS | 8 refills | Status: AC
Start: 2023-02-17 — End: ?

## 2023-02-17 NOTE — Progress Notes (Signed)
   Follow-Up Visit   Subjective  Sierra Foster is a 42 y.o. female who presents for the following: Rosacea  Patient present today for follow up visit for Rosacea. Patient was last evaluated on 08/12/22. Patient reports sxs are unchanged. Patient denies medication changes. Patient reports she is aware of her triggers and if she experiences a flare it typically last about a hour then the redness will subside.   The following portions of the chart were reviewed this encounter and updated as appropriate: medications, allergies, medical history  Review of Systems:  No other skin or systemic complaints except as noted in HPI or Assessment and Plan.  Objective  Well appearing patient in no apparent distress; mood and affect are within normal limits.  A focused examination was performed of the following areas: Face  Relevant exam findings are noted in the Assessment and Plan.    Assessment & Plan   ROSACEA Exam: Mid face erythema with telangiectasias +/- scattered inflammatory papules  Not at goal  Rosacea is a chronic progressive skin condition usually affecting the face of adults, causing redness and/or acne bumps. It is treatable but not curable. It sometimes affects the eyes (ocular rosacea) as well. It may respond to topical and/or systemic medication and can flare with stress, sun exposure, alcohol, exercise, topical steroids (including hydrocortisone/cortisone 10) and some foods.  Daily application of broad spectrum spf 30+ sunscreen to face is recommended to reduce flares.  Patient denies grittiness of the eyes  Treatment Plan: - Pt's rosacea is stable overall.  The redness/flushing will get triggered by hot/spicey foods and will last about 1 hours and then subside.  -Educated to continue applying sunscreen daily to the face - We will prescribe Soolantra to apply to the face daily - We will plan to follow up in 7 months and discuss transitioning from an OTC topical retinol to a  prescription tretinoin Rosacea  Related Medications metroNIDAZOLE (METROCREAM) 0.75 % cream Apply topically daily.  Azelaic Acid 15 % gel Apply 1 Application topically daily. After skin is thoroughly washed and patted dry, gently but thoroughly massage a thin film of azelaic acid cream into the affected area twice daily, in the morning and .  Ivermectin (SOOLANTRA) 1 % CREA Apply 1 Application topically daily.    Return in about 7 months (around 09/17/2023) for Rosacea F/U.    Documentation: I have reviewed the above documentation for accuracy and completeness, and I agree with the above.  Langston Reusing, DO

## 2023-02-17 NOTE — Patient Instructions (Signed)

## 2023-03-03 ENCOUNTER — Other Ambulatory Visit: Payer: Self-pay | Admitting: Dermatology

## 2023-03-03 DIAGNOSIS — L719 Rosacea, unspecified: Secondary | ICD-10-CM

## 2023-03-07 ENCOUNTER — Other Ambulatory Visit (HOSPITAL_COMMUNITY): Payer: Self-pay

## 2023-03-07 MED ORDER — IVERMECTIN 1 % EX CREA
1.0000 | TOPICAL_CREAM | Freq: Every day | CUTANEOUS | 8 refills | Status: AC
  Filled 2023-03-07: qty 45, 30d supply, fill #0

## 2023-03-08 ENCOUNTER — Other Ambulatory Visit (HOSPITAL_COMMUNITY): Payer: Self-pay

## 2023-03-15 DIAGNOSIS — R300 Dysuria: Secondary | ICD-10-CM | POA: Diagnosis not present

## 2023-03-15 DIAGNOSIS — N390 Urinary tract infection, site not specified: Secondary | ICD-10-CM | POA: Diagnosis not present

## 2023-04-24 ENCOUNTER — Other Ambulatory Visit (HOSPITAL_COMMUNITY): Payer: Self-pay

## 2023-04-25 ENCOUNTER — Other Ambulatory Visit (HOSPITAL_COMMUNITY): Payer: Self-pay

## 2023-04-25 MED ORDER — VALACYCLOVIR HCL 500 MG PO TABS
500.0000 mg | ORAL_TABLET | Freq: Every day | ORAL | 10 refills | Status: AC
  Filled 2023-04-25: qty 30, 30d supply, fill #0
  Filled 2024-01-23: qty 30, 30d supply, fill #1

## 2023-04-25 MED ORDER — SPIRONOLACTONE 50 MG PO TABS
50.0000 mg | ORAL_TABLET | Freq: Every day | ORAL | 2 refills | Status: DC
  Filled 2023-04-25: qty 90, 90d supply, fill #0
  Filled 2023-07-27: qty 90, 90d supply, fill #1
  Filled 2023-11-07: qty 90, 90d supply, fill #2

## 2023-05-02 ENCOUNTER — Other Ambulatory Visit (HOSPITAL_COMMUNITY): Payer: Self-pay

## 2023-05-04 ENCOUNTER — Other Ambulatory Visit (HOSPITAL_COMMUNITY): Payer: Self-pay

## 2023-05-12 DIAGNOSIS — H1089 Other conjunctivitis: Secondary | ICD-10-CM | POA: Diagnosis not present

## 2023-05-12 DIAGNOSIS — H6693 Otitis media, unspecified, bilateral: Secondary | ICD-10-CM | POA: Diagnosis not present

## 2023-05-12 DIAGNOSIS — R051 Acute cough: Secondary | ICD-10-CM | POA: Diagnosis not present

## 2023-05-12 DIAGNOSIS — J309 Allergic rhinitis, unspecified: Secondary | ICD-10-CM | POA: Diagnosis not present

## 2023-06-09 DIAGNOSIS — H60312 Diffuse otitis externa, left ear: Secondary | ICD-10-CM | POA: Diagnosis not present

## 2023-06-09 DIAGNOSIS — J309 Allergic rhinitis, unspecified: Secondary | ICD-10-CM | POA: Diagnosis not present

## 2023-06-09 DIAGNOSIS — H1013 Acute atopic conjunctivitis, bilateral: Secondary | ICD-10-CM | POA: Diagnosis not present

## 2023-06-15 ENCOUNTER — Telehealth (INDEPENDENT_AMBULATORY_CARE_PROVIDER_SITE_OTHER): Payer: Self-pay | Admitting: Otolaryngology

## 2023-06-15 NOTE — Telephone Encounter (Signed)
 LVM to confirm appt & location [id number] afm

## 2023-06-16 ENCOUNTER — Ambulatory Visit (INDEPENDENT_AMBULATORY_CARE_PROVIDER_SITE_OTHER): Payer: 59 | Admitting: Otolaryngology

## 2023-06-16 VITALS — BP 106/71 | HR 85

## 2023-06-16 DIAGNOSIS — H608X3 Other otitis externa, bilateral: Secondary | ICD-10-CM | POA: Diagnosis not present

## 2023-06-16 DIAGNOSIS — H6123 Impacted cerumen, bilateral: Secondary | ICD-10-CM | POA: Diagnosis not present

## 2023-06-16 NOTE — Progress Notes (Signed)
Patient ID: MOSHE BOAK, female   DOB: 02-02-1981, 43 y.o.   MRN: [id number]  Follow-up: Chronic otitis externa  HPI: The patient is a 43 year old female who returns today for her follow-up evaluation.  The patient has a history of bilateral chronic otitis externa.  She was previously treated with multiple debridement procedures, CSF powder, and Elocon cream.  According to the patient, she was doing well until November 2024, when she had an allergy flareup.  She noted significant itchy sensation in her eyes and her ears.  Her left ear was also severely swollen.  She was initially treated with doxycycline and prednisone, but her symptoms recurred.  She was subsequently treated with Z-Pak, prednisone, and Cortisporin eardrops.  Currently she complains of significant itchy sensation in her ears.  She denies any significant otalgia, otorrhea, hearing loss, or vertigo.  Exam: General: Communicates without difficulty, well nourished, no acute distress. Head: Normocephalic, no evidence injury, no tenderness, facial buttresses intact without stepoff. Face/sinus: No tenderness to palpation and percussion. Facial movement is normal and symmetric. Eyes: PERRL, EOMI. No scleral icterus, conjunctivae clear. Neuro: CN II exam reveals vision grossly intact.  No nystagmus at any point of gaze. EAC: Bilateral cerumen impaction.  Under the operating microscope, the cerumen is carefully removed with a combination of cerumen currette, alligator forceps, and suction catheters.  After the cerumen is removed, eczematous changes are noted in both ear canals. Nose: External evaluation reveals normal support and skin without lesions.  Dorsum is intact.  Anterior rhinoscopy reveals pink mucosa over anterior aspect of inferior turbinates and intact septum.  No purulence noted. Oral:  Oral cavity and oropharynx are intact, symmetric, without erythema or edema.  Mucosa is moist without lesions. Neck: Full range of motion without pain.   There is no significant lymphadenopathy.  No masses palpable.  Thyroid bed within normal limits to palpation.  Parotid glands and submandibular glands equal bilaterally without mass.  Trachea is midline. Neuro:  CN 2-12 grossly intact. Gait normal.   Assessment: 1.  Bilateral cerumen impaction.  After the disimpaction procedure, eczematous changes are noted in both ear canals. 2.  Her tympanic membranes and middle ear spaces are noted to be normal. 3.  Bilateral chronic eczematous otitis externa.  Plan  1.  Otomicroscopy with bilateral cerumen disimpaction. 2.  The physical exam findings are reviewed with the patient. 3.  Elocon cream and prednisolone eardrops to treat the chronic eczematous otitis externa. 4.  The patient is encouraged to call with any questions or concerns.

## 2023-06-17 DIAGNOSIS — H6123 Impacted cerumen, bilateral: Secondary | ICD-10-CM | POA: Insufficient documentation

## 2023-06-17 DIAGNOSIS — H608X3 Other otitis externa, bilateral: Secondary | ICD-10-CM | POA: Insufficient documentation

## 2023-06-23 ENCOUNTER — Encounter: Payer: Self-pay | Admitting: Internal Medicine

## 2023-06-23 ENCOUNTER — Ambulatory Visit: Payer: 59 | Admitting: Internal Medicine

## 2023-06-23 ENCOUNTER — Other Ambulatory Visit: Payer: Self-pay

## 2023-06-23 VITALS — BP 116/78 | HR 78 | Temp 98.6°F | Ht 64.3 in | Wt 169.3 lb

## 2023-06-23 DIAGNOSIS — J3089 Other allergic rhinitis: Secondary | ICD-10-CM | POA: Diagnosis not present

## 2023-06-23 DIAGNOSIS — L2089 Other atopic dermatitis: Secondary | ICD-10-CM | POA: Diagnosis not present

## 2023-06-23 MED ORDER — LEVOCETIRIZINE DIHYDROCHLORIDE 5 MG PO TABS: 5.0000 mg | ORAL_TABLET | Freq: Every evening | ORAL | 5 refills | Status: AC

## 2023-06-23 MED ORDER — FLUTICASONE PROPIONATE 50 MCG/ACT NA SUSP: 2.0000 | Freq: Every day | NASAL | 5 refills | Status: AC

## 2023-06-23 NOTE — Progress Notes (Signed)
NEW PATIENT  Date of Service/Encounter:  06/23/23  Consult requested by: Dois Davenport, MD   Subjective:   Sierra Foster (DOB: 1980-07-18) is a 43 y.o. female who presents to the clinic on 06/23/2023 with a chief complaint of Allergies (Flare up with her allergies.Itching eyes and ear, sneezing, drainage from her eyes. She has had double ears infection. She has been to her PCP and ENT.), Establish Care, Headache, and Eczema (Pt. Stated, the ENT told her she has eczema in her ears.) .    History obtained from: chart review and patient.  Rhinitis/Conjunctivitis: Started in adulthood.   Symptoms include: itchy, red eyes, headaches, nasal congestion, rhinorrhea, and sneezing, sniffling, throat clearing with PND Occurs seasonally-Fall/Winter  Potential triggers: not sure   Treatments tried:  Zyrtec/Claritin/Karbinal/Benadryl in the past; Xyzal daily now Olopatadine/Zaditor in past without relief  Flonase BID Bepotastine eye drops   Previous allergy testing: yes 2017; can't recall exact results but remembers cockroach/mold   History of sinus surgery: no Nonallergic triggers: none   Atopic Dermatitis:  Has had on and off trouble with ear eczema.  Recently saw Dr Suszanne Conners and underwent ear cleaning.  Also started on Mometasone cream and Pred Forte to use PRN.  Does not use any moisturizer. Does not get eczema anywhere else.   Reviewed:  06/15/2022: seen by Dr Suszanne Conners for bl chronic otitis externa.  Treated with antibiotics/steroids previously.  Otomicroscopy showed bl cerumen impaction with eczematous changes.  Rx elocon and prednisolone.  02/17/2023: seen by Dr Onalee [facility] for rosacea. Discussed use of topical metronidazole, ivermedctin and azelaicd acid.   12/23/2022: seen by Thane Edu for myopia and vitreous degeneration.   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 If you have any questions, ask your nurse or doctor.          STOP taking these medications    azithromycin 250 MG tablet Commonly known as: ZITHROMAX Stopped by: Birder Robson   Carbinoxamine Maleate 6 MG Tabs Commonly known as: RyVent Stopped by: Birder Robson   ibuprofen 200 MG tablet Commonly known as: ADVIL Stopped by: Birder Robson   ketotifen 0.025 % ophthalmic solution Commonly known as: ZADITOR Stopped by: Birder Robson   meloxicam 7.5 MG tablet Commonly known as: MOBIC Stopped by: Birder Robson   methocarbamol 750 MG tablet Commonly known as: ROBAXIN Stopped by: Birder Robson   NEOMYCIN-POLYMYXIN-HYDROCORTISONE 1 % Soln OTIC solution Commonly known  as: CORTISPORIN Stopped by: Birder Robson   predniSONE 20 MG tablet Commonly known as: DELTASONE Stopped by: Birder Robson       TAKE these medications    Azelaic Acid 15 % gel Apply 1 Application topically daily. After skin is thoroughly washed and patted dry, gently but thoroughly massage a thin film of azelaic acid cream into the affected area twice daily, in the morning and .   fluticasone 50 MCG/ACT nasal spray Commonly known as: FLONASE Place 2 sprays into both nostrils daily. What changed:  when to take  this reasons to take this Changed by: Birder Robson   Ivermectin 1 % Crea APPLY 1 APPLICATION TOPICALLY DAILY   Ivermectin 1 % Crea Place 1 Application onto the skin daily.   Kyleena 19.5 MG IUD Generic drug: levonorgestrel 1 Device by Intrauterine route once. Every 5 years   levocetirizine 5 MG tablet Commonly known as: XYZAL Take 1 tablet (5 mg total) by mouth every evening. Started by: Birder Robson   metroNIDAZOLE 0.75 % cream Commonly known as: METROCREAM Apply topically daily.   mometasone 0.1 % cream Commonly known as: ELOCON Apply topically to affected area daily as needed for itch   Omron 3 Series BP Monitor Devi Use as directed.   spironolactone 50 MG tablet Commonly known as: ALDACTONE Take 50 mg by mouth daily.   spironolactone 50 MG tablet Commonly known as: ALDACTONE Take 1 tablet (50 mg total) by mouth daily.   spironolactone 50 MG tablet Commonly known as: ALDACTONE Take 1 tablet (50 mg total) by mouth daily.   tiZANidine 4 MG tablet Commonly known as: ZANAFLEX Take 1 tablet (4 mg total) by mouth every 6-8 hours as needed. Not to exceed 3 doses in 24 hours   valACYclovir 500 MG tablet Commonly known as: VALTREX Take 1 tablet (500 mg total) by mouth daily. What changed: Another medication with the same name was removed. Continue taking this medication, and follow the directions you see here. Changed by: Birder Robson         REVIEW OF SYSTEMS: Pertinent positives and negatives discussed in HPI.   Objective:   Physical Exam: BP 116/78 (BP Location: Right Arm, Patient Position: Sitting, Cuff Size: Normal)   Pulse 78   Temp 98.6 F (37 C) (Temporal)   Ht 5' 4.3" (1.633 m)   Wt 169 lb 4.8 oz (76.8 kg)   SpO2 97%   BMI 28.79 kg/m  Body mass index is 28.79 kg/m. GEN: alert, well developed HEENT: clear conjunctiva, TM grey and translucent, nose with + mild inferior turbinate hypertrophy, pink nasal mucosa, slight clear rhinorrhea, +  cobblestoning HEART: regular rate and rhythm, no murmur LUNGS: clear to auscultation bilaterally, no coughing, unlabored respiration ABDOMEN: soft, non distended  SKIN: no rashes or lesions   Assessment:   1. Other allergic rhinitis   2. Other atopic dermatitis     Plan/Recommendations:   Other Allergic Rhinoconjunctivitis: - Due to turbinate hypertrophy, seasonal symptoms and unresponsive to over the counter meds, will perform skin testing to identify aeroallergen triggers.   - Use nasal saline rinses before nose sprays such as with Neilmed Sinus Rinse.  Use distilled water.   - Use Flonase 2 sprays each nostril daily or 1 spray twice daily. Aim upward and outward. - Use Xyzal 5 mg daily.  - Use Bepotastine 1.5% 1 eye drop twice daily as needed for itchy, watery, red eyes.  Hold all anti-histamines (Xyzal, Allegra, Zyrtec,  Claritin, Benadryl, Pepcid, allergy eye drops) 3 days prior to next visit.    Ear Eczema: - Use vaseline to moisturize outer ear. - Use Mometasone 0.1% topical twice daily for flare ups.  Maximum use 10 days.    Follow up: 9 AM on 1/30 for skin testing 1-55, IDs okay     Alesia Morin, MD [facility]

## 2023-06-23 NOTE — Patient Instructions (Addendum)
Other Allergic Rhinitis: - Use nasal saline rinses before nose sprays such as with Neilmed Sinus Rinse.  Use distilled water.   - Use Flonase 2 sprays each nostril daily or 1 spray twice daily. Aim upward and outward. - Use Xyzal 5 mg daily.  - Use Bepotastine 1.5% 1 eye drop twice daily as needed for itchy, watery, red eyes.  Hold all anti-histamines (Xyzal, Allegra, Zyrtec, Claritin, Benadryl, Pepcid, allergy eye drops) 3 days prior to next visit.    Ear Eczema: - Use vaseline to moisturize outer ear. - Use Mometasone 0.1% topical twice daily for flare ups.  Maximum use 10 days.    Follow up: 9 AM on 1/30 for skin testing 1-55

## 2023-06-30 ENCOUNTER — Ambulatory Visit (INDEPENDENT_AMBULATORY_CARE_PROVIDER_SITE_OTHER): Payer: 59 | Admitting: Internal Medicine

## 2023-06-30 DIAGNOSIS — J3089 Other allergic rhinitis: Secondary | ICD-10-CM

## 2023-06-30 DIAGNOSIS — L2089 Other atopic dermatitis: Secondary | ICD-10-CM

## 2023-06-30 MED ORDER — AZELASTINE HCL 0.1 % NA SOLN
2.0000 | Freq: Two times a day (BID) | NASAL | 5 refills | Status: AC | PRN
Start: 2023-06-30 — End: ?

## 2023-06-30 NOTE — Patient Instructions (Addendum)
Other Allergic Rhinitis: - SPT 06/2023: negative; will confirm with bloodwork  - Use nasal saline rinses before nose sprays such as with Neilmed Sinus Rinse.  Use distilled water.   - Use Flonase 2 sprays each nostril daily or 1 spray twice daily. Aim upward and outward. - Use Azelastine 2 sprays each nostril twice daily as needed for congestion, drainage, runny nose.  Aim upward and outward.  - Use Xyzal 5 mg daily.  - Use Bepotastine 1.5% 1 eye drop twice daily as needed for itchy, watery, red eyes. Discussed if both allergy tests (skin and blood) are negative then I am not convinced the eye symptoms are due to allergies and would need a re-evaluation with Ophthalmology.    Ear Eczema: - Use vaseline to moisturize outer ear. - Use Mometasone 0.1% topical twice daily for flare ups.  Maximum use 10 days.

## 2023-06-30 NOTE — Progress Notes (Signed)
FOLLOW UP Date of Service/Encounter:  06/30/23   Subjective:  Sierra Foster (DOB: February 24, 1981) is a 43 y.o. female who [facility] for follow up for skin testing.   History obtained from: chart review and patient.  Anti histamines held.   Past Medical History: Past Medical History:  Diagnosis Date   Chronic ear infection    as an adult    Objective:  There were no vitals taken for this visit. There is no height or weight on file to calculate BMI. Physical Exam: GEN: alert, well developed HEENT: clear conjunctiva, MMM LUNGS: unlabored respiration  Skin Testing:  Skin prick testing was placed, which includes aeroallergens/foods, histamine control, and saline control.  Verbal consent was obtained prior to placing test.  Patient tolerated procedure well.  Allergy testing results were read and interpreted by myself, documented by clinical staff. Adequate positive and negative control.  Positive results to:  Results discussed with patient/family.  Airborne Adult Perc - 06/30/23 0855     Time Antigen Placed 0855    Allergen Manufacturer Waynette Buttery    Location Back    Number of Test 55    1. Control-Buffer 50% Glycerol Negative    2. Control-Histamine 2+    3. Bahia Negative    4. French [place] Negative    5. Johnson Negative    6. Kentucky Blue Negative    7. Meadow Fescue Negative    8. Perennial Rye Negative    9. Timothy Negative    10. Ragweed Mix Negative    11. Cocklebur Negative    12. Plantain,  English Negative    13. Baccharis Negative    14. Dog Fennel Negative    15. Russian Thistle Negative    16. Lamb's Quarters Negative    17. Sheep Sorrell Negative    18. Rough Pigweed Negative    19. Marsh Elder, Rough Negative    20. Mugwort, Common Negative    21. Box, Elder Negative    22. Cedar, red Negative    23. Sweet Gum Negative    24. Pecan Pollen Negative    25. Pine Mix Negative    26. Walnut, Black Pollen Negative     27. Red Mulberry Negative    28. Ash Mix Negative    29. Birch Mix Negative    30. Beech American Negative    31. Cottonwood, [place] Negative    32. Hickory, White Negative    33. Maple Mix Negative    34. Oak, [place] Mix Negative    35. Sycamore Eastern Negative    36. Alternaria Alternata Negative    37. Cladosporium Herbarum Negative    38. Aspergillus Mix Negative    39. Penicillium Mix Negative    40. Bipolaris Sorokiniana (Helminthosporium) Negative    41. Drechslera Spicifera (Curvularia) Negative    42. Mucor Plumbeus Negative    43. Fusarium Moniliforme Negative    44. Aureobasidium Pullulans (pullulara) Negative    45. Rhizopus Oryzae Negative    46. Botrytis Cinera Negative    47. Epicoccum Nigrum Negative    48. Phoma Betae Negative    49. Dust Mite Mix Negative    50. Cat Hair 10,000 BAU/ml Negative    51.  Dog Epithelia Negative    52. Mixed Feathers Negative    53. Horse Epithelia Negative    54. Cockroach, German Negative    55. Tobacco Leaf Negative  Assessment:   1. Other allergic rhinitis   2. Other atopic dermatitis     Plan/Recommendations:   Other Allergic Rhinitis: - Due to turbinate hypertrophy, seasonal symptoms, frequent eye symptoms, eczema and unresponsive to over the counter meds, will perform skin testing to identify aeroallergen triggers.   - SPT 06/2023: negative; will confirm with bloodwork  - Use nasal saline rinses before nose sprays such as with Neilmed Sinus Rinse.  Use distilled water.   - Use Flonase 2 sprays each nostril daily or 1 spray twice daily. Aim upward and outward. - Use Azelastine 2 sprays each nostril twice daily as needed for congestion, drainage, runny nose.  Aim upward and outward.  - Use Xyzal 5 mg daily.  - Use Bepotastine 1.5% 1 eye drop twice daily as needed for itchy, watery, red eyes. Discussed if both allergy tests (skin and blood) are negative then I am not convinced the eye symptoms are  due to allergies and would need a re-evaluation with Ophthalmology.    Ear Eczema: - Use vaseline to moisturize outer ear. - Use Mometasone 0.1% topical twice daily for flare ups.  Maximum use 10 days.   Return in about 3 months (around 09/28/2023).  Alesia Morin, MD [facility]

## 2023-07-02 LAB — ALLERGENS W/TOTAL IGE AREA 2

## 2023-07-15 DIAGNOSIS — N390 Urinary tract infection, site not specified: Secondary | ICD-10-CM | POA: Diagnosis not present

## 2023-07-15 DIAGNOSIS — R300 Dysuria: Secondary | ICD-10-CM | POA: Diagnosis not present

## 2023-08-04 DIAGNOSIS — Z30433 Encounter for removal and reinsertion of intrauterine contraceptive device: Secondary | ICD-10-CM | POA: Diagnosis not present

## 2023-08-21 ENCOUNTER — Ambulatory Visit

## 2023-09-15 ENCOUNTER — Encounter: Payer: Self-pay | Admitting: Dermatology

## 2023-09-15 ENCOUNTER — Ambulatory Visit (INDEPENDENT_AMBULATORY_CARE_PROVIDER_SITE_OTHER): Payer: 59 | Admitting: Dermatology

## 2023-09-15 VITALS — BP 115/65

## 2023-09-15 DIAGNOSIS — W908XXA Exposure to other nonionizing radiation, initial encounter: Secondary | ICD-10-CM | POA: Diagnosis not present

## 2023-09-15 DIAGNOSIS — L821 Other seborrheic keratosis: Secondary | ICD-10-CM

## 2023-09-15 DIAGNOSIS — Z86018 Personal history of other benign neoplasm: Secondary | ICD-10-CM

## 2023-09-15 DIAGNOSIS — L578 Other skin changes due to chronic exposure to nonionizing radiation: Secondary | ICD-10-CM | POA: Diagnosis not present

## 2023-09-15 DIAGNOSIS — D1801 Hemangioma of skin and subcutaneous tissue: Secondary | ICD-10-CM

## 2023-09-15 DIAGNOSIS — Z1283 Encounter for screening for malignant neoplasm of skin: Secondary | ICD-10-CM | POA: Diagnosis not present

## 2023-09-15 DIAGNOSIS — Z30431 Encounter for routine checking of intrauterine contraceptive device: Secondary | ICD-10-CM | POA: Diagnosis not present

## 2023-09-15 DIAGNOSIS — D229 Melanocytic nevi, unspecified: Secondary | ICD-10-CM

## 2023-09-15 DIAGNOSIS — L814 Other melanin hyperpigmentation: Secondary | ICD-10-CM | POA: Diagnosis not present

## 2023-09-15 NOTE — Patient Instructions (Signed)

## 2023-09-15 NOTE — Progress Notes (Signed)
   Follow-Up Visit   Subjective  Sierra Foster is a 43 y.o. female who presents for the following: Skin Cancer Screening and Full Body Skin Exam  The patient presents for Total-Body Skin Exam (TBSE) for skin cancer screening and mole check. The patient has spots, moles and lesions to be evaluated, some may be new or changing and the patient may have concern these could be cancer.    The following portions of the chart were reviewed this encounter and updated as appropriate: medications, allergies, medical history  Review of Systems:  No other skin or systemic complaints except as noted in HPI or Assessment and Plan.  Objective  Well appearing patient in no apparent distress; mood and affect are within normal limits.  A full examination was performed including scalp, head, eyes, ears, nose, lips, neck, chest, axillae, abdomen, back, buttocks, bilateral upper extremities, bilateral lower extremities, hands, feet, fingers, toes, fingernails, and toenails. All findings within normal limits unless otherwise noted below.   Relevant physical exam findings are noted in the Assessment and Plan.    Assessment & Plan   SKIN CANCER SCREENING PERFORMED TODAY.  ACTINIC DAMAGE - Chronic condition, secondary to cumulative UV/sun exposure - diffuse scaly erythematous macules with underlying dyspigmentation - Recommend daily broad spectrum sunscreen SPF 30+ to sun-exposed areas, reapply every 2 hours as needed.  - Staying in the shade or wearing long sleeves, sun glasses (UVA+UVB protection) and wide brim hats (4-inch brim around the entire circumference of the hat) are also recommended for sun protection.  - Call for new or changing lesions.  LENTIGINES, SEBORRHEIC KERATOSES, HEMANGIOMAS - Benign normal skin lesions - Benign-appearing - Call for any changes  MELANOCYTIC NEVI - Tan-brown and/or pink-flesh-colored symmetric macules and papules - Benign appearing on exam today - Observation -  Call clinic for new or changing moles - Recommend daily use of broad spectrum spf 30+ sunscreen to sun-exposed areas.   History of Dysplastic Nevus of Right Breast - No evidence of recurrence today - Recommend regular full body skin exams - Recommend daily broad spectrum sunscreen SPF 30+ to sun-exposed areas, reapply every 2 hours as needed.  - Call if any new or changing lesions are noted between office visits    Return in about 1 year (around 09/14/2024) for TBSE History of dysplastic nevus.  I, Eliot Guernsey, CMA, am acting as scribe for [employer], DO .   Documentation: I have reviewed the above documentation for accuracy and completeness, and I agree with the above.  Louana Roup, DO

## 2023-11-17 DIAGNOSIS — R300 Dysuria: Secondary | ICD-10-CM | POA: Diagnosis not present

## 2023-11-17 DIAGNOSIS — N390 Urinary tract infection, site not specified: Secondary | ICD-10-CM | POA: Diagnosis not present

## 2023-11-30 ENCOUNTER — Other Ambulatory Visit (HOSPITAL_COMMUNITY): Payer: Self-pay

## 2023-11-30 MED ORDER — TIZANIDINE HCL 4 MG PO TABS
4.0000 mg | ORAL_TABLET | Freq: Four times a day (QID) | ORAL | 2 refills | Status: AC | PRN
  Filled 2023-11-30: qty 45, 12d supply, fill #0
  Filled 2024-02-08: qty 45, 12d supply, fill #1

## 2023-12-22 DIAGNOSIS — M5416 Radiculopathy, lumbar region: Secondary | ICD-10-CM | POA: Diagnosis not present

## 2023-12-22 DIAGNOSIS — Z6828 Body mass index (BMI) 28.0-28.9, adult: Secondary | ICD-10-CM | POA: Diagnosis not present

## 2024-01-05 DIAGNOSIS — R300 Dysuria: Secondary | ICD-10-CM | POA: Diagnosis not present

## 2024-01-05 DIAGNOSIS — Z7184 Encounter for health counseling related to travel: Secondary | ICD-10-CM | POA: Diagnosis not present

## 2024-01-05 DIAGNOSIS — N390 Urinary tract infection, site not specified: Secondary | ICD-10-CM | POA: Diagnosis not present

## 2024-01-05 DIAGNOSIS — Z0001 Encounter for general adult medical examination with abnormal findings: Secondary | ICD-10-CM | POA: Diagnosis not present

## 2024-01-19 DIAGNOSIS — M5416 Radiculopathy, lumbar region: Secondary | ICD-10-CM | POA: Diagnosis not present

## 2024-01-20 DIAGNOSIS — N958 Other specified menopausal and perimenopausal disorders: Secondary | ICD-10-CM | POA: Diagnosis not present

## 2024-01-20 DIAGNOSIS — N390 Urinary tract infection, site not specified: Secondary | ICD-10-CM | POA: Diagnosis not present

## 2024-01-20 DIAGNOSIS — B3731 Acute candidiasis of vulva and vagina: Secondary | ICD-10-CM | POA: Diagnosis not present

## 2024-02-03 ENCOUNTER — Encounter (HOSPITAL_COMMUNITY)
Admission: RE | Admit: 2024-02-03 | Discharge: 2024-02-03 | Disposition: A | Discharge status: Home / Self Care | Source: Ambulatory Visit | Attending: Family Medicine | Admitting: Family Medicine

## 2024-02-03 ENCOUNTER — Other Ambulatory Visit (HOSPITAL_COMMUNITY): Payer: Self-pay | Admitting: Family Medicine

## 2024-02-03 VITALS — BP 135/82 | HR 76 | Temp 98.2°F | Resp 18 | Wt 163.0 lb

## 2024-02-03 DIAGNOSIS — N390 Urinary tract infection, site not specified: Secondary | ICD-10-CM | POA: Diagnosis not present

## 2024-02-03 DIAGNOSIS — R300 Dysuria: Secondary | ICD-10-CM | POA: Diagnosis not present

## 2024-02-03 MED ORDER — CIPROFLOXACIN IN D5W 400 MG/200ML IV SOLN
400.0000 mg | Freq: Once | INTRAVENOUS | Status: AC
  Administered 2024-02-03: 400 mg via INTRAVENOUS
  Filled 2024-02-03: qty 200

## 2024-02-06 ENCOUNTER — Other Ambulatory Visit (HOSPITAL_COMMUNITY): Payer: Self-pay

## 2024-02-06 DIAGNOSIS — R300 Dysuria: Secondary | ICD-10-CM | POA: Diagnosis not present

## 2024-02-07 ENCOUNTER — Other Ambulatory Visit (HOSPITAL_COMMUNITY): Payer: Self-pay

## 2024-02-07 MED ORDER — SPIRONOLACTONE 50 MG PO TABS
50.0000 mg | ORAL_TABLET | Freq: Every day | ORAL | 0 refills | Status: DC
  Filled 2024-02-07: qty 90, 90d supply, fill #0

## 2024-02-09 ENCOUNTER — Other Ambulatory Visit (HOSPITAL_COMMUNITY): Payer: Self-pay

## 2024-02-09 DIAGNOSIS — H5213 Myopia, bilateral: Secondary | ICD-10-CM | POA: Diagnosis not present

## 2024-02-10 ENCOUNTER — Other Ambulatory Visit (HOSPITAL_COMMUNITY): Payer: Self-pay

## 2024-02-10 ENCOUNTER — Encounter (HOSPITAL_COMMUNITY): Payer: Self-pay | Admitting: Pulmonary Disease

## 2024-02-10 MED ORDER — FLUZONE 0.5 ML IM SUSY
0.5000 mL | PREFILLED_SYRINGE | Freq: Once | INTRAMUSCULAR | 0 refills | Status: AC
  Filled 2024-02-10: qty 0.5, 1d supply, fill #0

## 2024-03-06 DIAGNOSIS — M5416 Radiculopathy, lumbar region: Secondary | ICD-10-CM | POA: Diagnosis not present

## 2024-03-06 DIAGNOSIS — Z6829 Body mass index (BMI) 29.0-29.9, adult: Secondary | ICD-10-CM | POA: Diagnosis not present

## 2024-03-29 ENCOUNTER — Other Ambulatory Visit (HOSPITAL_COMMUNITY): Payer: Self-pay

## 2024-03-30 ENCOUNTER — Other Ambulatory Visit (HOSPITAL_COMMUNITY): Payer: Self-pay

## 2024-04-19 ENCOUNTER — Other Ambulatory Visit (HOSPITAL_COMMUNITY): Payer: Self-pay

## 2024-04-19 DIAGNOSIS — Z01419 Encounter for gynecological examination (general) (routine) without abnormal findings: Secondary | ICD-10-CM | POA: Diagnosis not present

## 2024-04-19 DIAGNOSIS — Z1231 Encounter for screening mammogram for malignant neoplasm of breast: Secondary | ICD-10-CM | POA: Diagnosis not present

## 2024-04-19 DIAGNOSIS — Z6829 Body mass index (BMI) 29.0-29.9, adult: Secondary | ICD-10-CM | POA: Diagnosis not present

## 2024-04-19 MED ORDER — SPIRONOLACTONE 50 MG PO TABS
50.0000 mg | ORAL_TABLET | Freq: Every day | ORAL | 3 refills | Status: AC
Start: 2024-04-19 — End: ?
  Filled 2024-04-19: qty 90, 90d supply, fill #0

## 2024-04-24 ENCOUNTER — Other Ambulatory Visit (HOSPITAL_COMMUNITY): Payer: Self-pay

## 2024-04-24 MED ORDER — NITROFURANTOIN MONOHYD MACRO 100 MG PO CAPS
100.0000 mg | ORAL_CAPSULE | Freq: Two times a day (BID) | ORAL | 0 refills | Status: AC
Start: 2024-04-24 — End: ?
  Filled 2024-04-24: qty 14, 7d supply, fill #0

## 2024-05-17 ENCOUNTER — Other Ambulatory Visit (HOSPITAL_COMMUNITY): Payer: Self-pay

## 2024-05-17 MED ORDER — PROGESTERONE MICRONIZED 100 MG PO CAPS
100.0000 mg | ORAL_CAPSULE | Freq: Every day | ORAL | 3 refills | Status: AC
  Filled 2024-05-17: qty 30, 30d supply, fill #0
  Filled 2024-06-12: qty 30, 30d supply, fill #1

## 2024-07-03 ENCOUNTER — Other Ambulatory Visit (HOSPITAL_COMMUNITY): Payer: Self-pay

## 2024-07-03 MED ORDER — NORETHINDRONE ACETATE 5 MG PO TABS
2.5000 mg | ORAL_TABLET | Freq: Every day | ORAL | 8 refills | Status: AC
  Filled 2024-07-03: qty 15, 30d supply, fill #0

## 2024-09-20 ENCOUNTER — Ambulatory Visit: Admitting: Dermatology
